# Patient Record
Sex: Female | Born: 1995 | Race: White | Hispanic: No | Marital: Single | Smoking: Never smoker
Health system: Southern US, Community
[De-identification: ages and names within clinical notes are randomized; demographics above are authoritative.]

## PROBLEM LIST (undated history)

## (undated) ENCOUNTER — Inpatient Hospital Stay: Payer: Self-pay

## (undated) DIAGNOSIS — R51 Headache: Secondary | ICD-10-CM

## (undated) DIAGNOSIS — B009 Herpesviral infection, unspecified: Secondary | ICD-10-CM

## (undated) DIAGNOSIS — R112 Nausea with vomiting, unspecified: Secondary | ICD-10-CM

## (undated) DIAGNOSIS — J45909 Unspecified asthma, uncomplicated: Secondary | ICD-10-CM

## (undated) DIAGNOSIS — Z9889 Other specified postprocedural states: Secondary | ICD-10-CM

## (undated) DIAGNOSIS — R519 Headache, unspecified: Secondary | ICD-10-CM

## (undated) DIAGNOSIS — K219 Gastro-esophageal reflux disease without esophagitis: Secondary | ICD-10-CM

## (undated) HISTORY — PX: WISDOM TOOTH EXTRACTION: SHX21

## (undated) HISTORY — PX: EYE SURGERY: SHX253

---
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
## 2015-09-26 ENCOUNTER — Encounter: Payer: Self-pay | Admitting: Emergency Medicine

## 2015-09-26 ENCOUNTER — Emergency Department
Admission: EM | Admit: 2015-09-26 | Discharge: 2015-09-26 | Disposition: A | Discharge status: Home / Self Care | Payer: Self-pay | Attending: Emergency Medicine | Admitting: Emergency Medicine

## 2015-09-26 DIAGNOSIS — Z3202 Encounter for pregnancy test, result negative: Secondary | ICD-10-CM | POA: Insufficient documentation

## 2015-09-26 DIAGNOSIS — R300 Dysuria: Secondary | ICD-10-CM | POA: Insufficient documentation

## 2015-09-26 LAB — URINALYSIS COMPLETE WITH MICROSCOPIC (ARMC ONLY)
BILIRUBIN URINE: NEGATIVE
GLUCOSE, UA: NEGATIVE mg/dL
HGB URINE DIPSTICK: NEGATIVE
Ketones, ur: NEGATIVE mg/dL
NITRITE: NEGATIVE
Protein, ur: NEGATIVE mg/dL
RBC / HPF: NONE SEEN RBC/hpf (ref 0–5)
Specific Gravity, Urine: 1.016 (ref 1.005–1.030)
WBC, UA: NONE SEEN WBC/hpf (ref 0–5)
pH: 7 (ref 5.0–8.0)

## 2015-09-26 LAB — POCT PREGNANCY, URINE: Preg Test, Ur: NEGATIVE

## 2015-09-26 MED ORDER — FLUCONAZOLE 150 MG PO TABS: 150.0000 mg | ORAL_TABLET | Freq: Every day | ORAL | Status: DC

## 2015-09-26 MED ORDER — PHENAZOPYRIDINE HCL 200 MG PO TABS: 200.0000 mg | ORAL_TABLET | Freq: Three times a day (TID) | ORAL | Status: DC | PRN

## 2015-09-26 NOTE — ED Notes (Signed)
Pt to ed with c/o urinary frequency and burning x 1 week.

## 2015-09-26 NOTE — ED Notes (Signed)
Urinary freq and pain for couple of days

## 2015-09-26 NOTE — ED Provider Notes (Signed)
[facility] Emergency Department Provider Note  ____________________________________________  Time seen: Approximately 5:15 PM  I have reviewed the triage vital signs and the nursing notes.   HISTORY  Chief Complaint Urinary Frequency    HPI Brandy Ward is a 20 y.o. female patient complained of urinary frequency, urgency and burning for one week. Patient denies any fever associated with this complaint patient denies any flank pain. Patient whitish vaginal discharge 2 days ago and none today. Denies any vaginal lesions. Patient rates her pain discomfort as 8/10. No palliative measures taken for this complaint. Patient stated no menstrual cycle secondary to birth control.Marland Kitchen  History reviewed. No pertinent past medical history.  There are no active problems to display for this patient.   History reviewed. No pertinent past surgical history.  Current Outpatient Rx  Name  Route  Sig  Dispense  Refill  . fluconazole (DIFLUCAN) 150 MG tablet   Oral   Take 1 tablet (150 mg total) by mouth daily.   1 tablet   0   . phenazopyridine (PYRIDIUM) 200 MG tablet   Oral   Take 1 tablet (200 mg total) by mouth 3 (three) times daily as needed for pain.   6 tablet   0     Allergies Review of patient's allergies indicates no known allergies.  History reviewed. No pertinent family history.  Social History Social History  Substance Use Topics  . Smoking status: Never Smoker   . Smokeless tobacco: None  . Alcohol Use: No    Review of Systems Constitutional: No fever/chills Eyes: No visual changes. ENT: No sore throat. Cardiovascular: Denies chest pain. Respiratory: Denies shortness of breath. Gastrointestinal: No abdominal pain.  No nausea, no vomiting.  No diarrhea.  No constipation. Genitourinary: Positive for dysuria. Musculoskeletal: Negative for back pain. Skin: Negative for rash. Neurological: Negative for headaches, focal weakness or  numbness. 10-point ROS otherwise negative.  ____________________________________________   PHYSICAL EXAM:  VITAL SIGNS: ED Triage Vitals  Enc Vitals Group     BP 09/26/15 1612 127/74 mmHg     Pulse Rate 09/26/15 1612 91     Resp 09/26/15 1612 20     Temp 09/26/15 1612 97.5 F (36.4 C)     Temp Source 09/26/15 1612 Oral     SpO2 09/26/15 1612 100 %     Weight 09/26/15 1612 140 lb (63.504 kg)     Height 09/26/15 1612  (1.651 m)     Head Cir --      Peak Flow --      Pain Score 09/26/15 1612 8     Pain Loc --      Pain Edu? --      Excl. in GC? --     Constitutional: Alert and oriented. Well appearing and in no acute distress. Eyes: Conjunctivae are normal. PERRL. EOMI. Head: Atraumatic. Nose: No congestion/rhinnorhea. Mouth/Throat: Mucous membranes are moist.  Oropharynx non-erythematous. Neck: No stridor.  No cervical spine tenderness to palpation. Hematological/Lymphatic/Immunilogical: No cervical lymphadenopathy. Cardiovascular: Normal rate, regular rhythm. Grossly normal heart sounds.  Good peripheral circulation. Respiratory: Normal respiratory effort.  No retractions. Lungs CTAB. Gastrointestinal: Soft and nontender. No distention. No abdominal bruits. No CVA tenderness. Genitourinary: Exam deferred Musculoskeletal: No lower extremity tenderness nor edema.  No joint effusions. Neurologic:  Normal speech and language. No gross focal neurologic deficits are appreciated. No gait instability. Skin:  Skin is warm, dry and intact. No rash noted. Psychiatric: Mood and affect are normal. Speech  and behavior are normal.  ____________________________________________   LABS (all labs ordered are listed, but only abnormal results are displayed)  Labs Reviewed  URINALYSIS COMPLETEWITH MICROSCOPIC ([facility] ONLY) - Abnormal; Notable for the following:    Color, Urine YELLOW (*)    APPearance HAZY (*)    Leukocytes, UA TRACE (*)    Bacteria, UA RARE (*)    Squamous  Epithelial / LPF 0-5 (*)    All other components within normal limits  POC URINE PREG, ED  POCT PREGNANCY, URINE   ____________________________________________  EKG   ____________________________________________  RADIOLOGY   ____________________________________________   PROCEDURES  Procedure(s) performed: None  Critical Care performed: No  ____________________________________________   INITIAL IMPRESSION / ASSESSMENT AND PLAN / ED COURSE  Pertinent labs & imaging results that were available during my care of the patient were reviewed by me and considered in my medical decision making (see chart for details).  Dysuria. Discussed negative findings on urinalysis. Due to the patient's history of dysuria and a whitish vaginal discharge was treated with Pyridium and Diflucan. Advised patient to follow-up with the [facility] clinic if there is no improvement or complaint in 2-3 days.  ____________________________________________   FINAL CLINICAL IMPRESSION(S) / ED DIAGNOSES  Final diagnoses:  Dysuria      Joni Reining, PA-C 09/26/15 1749  Phineas Semen, MD 09/26/15 1859

## 2015-09-26 NOTE — Discharge Instructions (Signed)

## 2015-12-18 ENCOUNTER — Emergency Department
Admission: EM | Admit: 2015-12-18 | Discharge: 2015-12-18 | Disposition: A | Discharge status: Home / Self Care | Payer: Medicaid Other | Attending: Emergency Medicine | Admitting: Emergency Medicine

## 2015-12-18 DIAGNOSIS — J45909 Unspecified asthma, uncomplicated: Secondary | ICD-10-CM | POA: Insufficient documentation

## 2015-12-18 DIAGNOSIS — J029 Acute pharyngitis, unspecified: Secondary | ICD-10-CM | POA: Insufficient documentation

## 2015-12-18 HISTORY — DX: Unspecified asthma, uncomplicated: J45.909

## 2015-12-18 LAB — POCT RAPID STREP A: Streptococcus, Group A Screen (Direct): NEGATIVE

## 2015-12-18 MED ORDER — LIDOCAINE VISCOUS 2 % MT SOLN: 20.0000 mL | OROMUCOSAL | Status: DC | PRN

## 2015-12-18 MED ORDER — AMOXICILLIN 500 MG PO TABS: 500.0000 mg | ORAL_TABLET | Freq: Two times a day (BID) | ORAL | Status: DC

## 2015-12-18 NOTE — Discharge Instructions (Signed)

## 2015-12-18 NOTE — ED Provider Notes (Signed)
[facility] Emergency Department Provider Note  ____________________________________________  Time seen: Approximately 11:52 AM  I have reviewed the triage vital signs and the nursing notes.   HISTORY  Chief Complaint Sore Throat    HPI Brandy Ward is a 20 y.o. female resents for evaluation of sore throats and chest today. Patient states that extremely painful to swallow. Feels like she swallowing razor blades. Denies any trauma no other injuries denies any runny nose congestion drainage. Describes her pain as 8 out of 10 sudden onset.   Past Medical History  Diagnosis Date  . Asthma     There are no active problems to display for this patient.   History reviewed. No pertinent past surgical history.  Current Outpatient Rx  Name  Route  Sig  Dispense  Refill  . amoxicillin (AMOXIL) 500 MG tablet   Oral   Take 1 tablet (500 mg total) by mouth 2 (two) times daily.   20 tablet   0   . fluconazole (DIFLUCAN) 150 MG tablet   Oral   Take 1 tablet (150 mg total) by mouth daily.   1 tablet   0   . lidocaine (XYLOCAINE) 2 % solution   Mouth/Throat   Use as directed 20 mLs in the mouth or throat as needed for mouth pain.   100 mL   0   . phenazopyridine (PYRIDIUM) 200 MG tablet   Oral   Take 1 tablet (200 mg total) by mouth 3 (three) times daily as needed for pain.   6 tablet   0     Allergies Review of patient's allergies indicates no known allergies.  No family history on file.  Social History Social History  Substance Use Topics  . Smoking status: Never Smoker   . Smokeless tobacco: None  . Alcohol Use: No    Review of Systems Constitutional: No fever/chills Eyes: No visual changes. ENT: Positive sore throat. Cardiovascular: Denies chest pain. Respiratory: Denies shortness of breath. Musculoskeletal: Negative for back pain. Skin: Negative for rash. Neurological: Negative for headaches, focal weakness or  numbness.  10-point ROS otherwise negative.  ____________________________________________   PHYSICAL EXAM:  VITAL SIGNS: ED Triage Vitals  Enc Vitals Group     BP 12/18/15 1124 110/67 mmHg     Pulse Rate 12/18/15 1124 76     Resp 12/18/15 1124 16     Temp 12/18/15 1124 97.7 F (36.5 C)     Temp Source 12/18/15 1124 Oral     SpO2 12/18/15 1124 100 %     Weight 12/18/15 1124 130 lb (58.968 kg)     Height 12/18/15 1124  (1.676 m)     Head Cir --      Peak Flow --      Pain Score 12/18/15 1125 8     Pain Loc --      Pain Edu? --      Excl. in GC? --     Constitutional: Alert and oriented. Well appearing and in no acute distress. Nose: No congestion/rhinnorhea. Mouth/Throat: Mucous membranes are moist.  Oropharynx very erythematous. No exudate noted. Neck: No stridor.  No cervical adenopathy noted. Cardiovascular: Normal rate, regular rhythm. Grossly normal heart sounds.  Good peripheral circulation. Respiratory: Normal respiratory effort.  No retractions. Lungs CTAB. Musculoskeletal: No lower extremity tenderness nor edema.  No joint effusions. Neurologic:  Normal speech and language. No gross focal neurologic deficits are appreciated. No gait instability. Skin:  Skin is warm, dry  and intact. No rash noted. Psychiatric: Mood and affect are normal. Speech and behavior are normal.  ____________________________________________   LABS (all labs ordered are listed, but only abnormal results are displayed)  Labs Reviewed  POCT RAPID STREP A   ____________________________________________    PROCEDURES  Procedure(s) performed: None  Critical Care performed: No  ____________________________________________   INITIAL IMPRESSION / ASSESSMENT AND PLAN / ED COURSE  Pertinent labs & imaging results that were available during my care of the patient were reviewed by me and considered in my medical decision making (see chart for details).  Acute pharyngitis. Rx given  for amoxicillin 500 mg and viscous lidocaine solution. Patient follow-up with PCP or return to ER with any worsening symptomology. Patient voices no other emergency medical complaints at this time. ____________________________________________   FINAL CLINICAL IMPRESSION(S) / ED DIAGNOSES  Final diagnoses:  Acute pharyngitis, unspecified pharyngitis type     This chart was dictated using voice recognition software/Dragon. Despite best efforts to proofread, errors can occur which can change the meaning. Any change was purely unintentional.   Evangeline Dakinharles M Yekaterina Escutia, PA-C 12/18/15 1430  Jeanmarie PlantJames A McShane, MD 12/18/15 [id number]

## 2015-12-18 NOTE — ED Notes (Signed)
Pt c/o sore throat since yesterday 

## 2016-02-06 ENCOUNTER — Encounter: Payer: Self-pay | Admitting: Emergency Medicine

## 2016-02-06 ENCOUNTER — Emergency Department
Admission: EM | Admit: 2016-02-06 | Discharge: 2016-02-07 | Disposition: A | Discharge status: Home / Self Care | Payer: Medicaid Other | Attending: Student | Admitting: Student

## 2016-02-06 DIAGNOSIS — Z79899 Other long term (current) drug therapy: Secondary | ICD-10-CM | POA: Insufficient documentation

## 2016-02-06 DIAGNOSIS — R1033 Periumbilical pain: Secondary | ICD-10-CM

## 2016-02-06 DIAGNOSIS — R110 Nausea: Secondary | ICD-10-CM | POA: Insufficient documentation

## 2016-02-06 DIAGNOSIS — J45909 Unspecified asthma, uncomplicated: Secondary | ICD-10-CM | POA: Insufficient documentation

## 2016-02-06 LAB — COMPREHENSIVE METABOLIC PANEL
ALBUMIN: 4.7 g/dL (ref 3.5–5.0)
ALK PHOS: 78 U/L (ref 38–126)
ALT: 29 U/L (ref 14–54)
AST: 26 U/L (ref 15–41)
Anion gap: 6 (ref 5–15)
BUN: 12 mg/dL (ref 6–20)
CHLORIDE: 104 mmol/L (ref 101–111)
CO2: 27 mmol/L (ref 22–32)
CREATININE: 0.61 mg/dL (ref 0.44–1.00)
Calcium: 9 mg/dL (ref 8.9–10.3)
GFR calc non Af Amer: >60 mL/min (ref >60)
Glucose, Bld: 93 mg/dL (ref 65–99)
POTASSIUM: 3.8 mmol/L (ref 3.5–5.1)
SODIUM: 137 mmol/L (ref 135–145)
Total Bilirubin: 0.7 mg/dL (ref 0.3–1.2)
Total Protein: 7.7 g/dL (ref 6.5–8.1)

## 2016-02-06 LAB — CBC
HEMATOCRIT: 42.2 % (ref 35.0–47.0)
HEMOGLOBIN: 15 g/dL (ref 12.0–16.0)
MCH: 32 pg (ref 26.0–34.0)
MCHC: 35.5 g/dL (ref 32.0–36.0)
MCV: 90 fL (ref 80.0–100.0)
PLATELETS: 202 10*3/uL (ref 150–440)
RBC: 4.69 MIL/uL (ref 3.80–5.20)
RDW: 12 % (ref 11.5–14.5)
WBC: 7.2 10*3/uL (ref 3.6–11.0)

## 2016-02-06 LAB — LIPASE, BLOOD: Lipase: 22 U/L (ref 11–51)

## 2016-02-06 LAB — URINALYSIS COMPLETE WITH MICROSCOPIC (ARMC ONLY)
Bacteria, UA: NONE SEEN
Bilirubin Urine: NEGATIVE
Glucose, UA: NEGATIVE mg/dL
KETONES UR: NEGATIVE mg/dL
LEUKOCYTES UA: NEGATIVE
Nitrite: NEGATIVE
PH: 6 (ref 5.0–8.0)
PROTEIN: NEGATIVE mg/dL
Specific Gravity, Urine: 1.005 (ref 1.005–1.030)

## 2016-02-06 LAB — POCT PREGNANCY, URINE: PREG TEST UR: NEGATIVE

## 2016-02-06 NOTE — ED Notes (Signed)
Patient ambulatory to triage with steady gait, without difficulty or distress noted; pt reports generalized abd pain x 2 days accomp by nausea

## 2016-02-07 MED ORDER — POLYETHYLENE GLYCOL 3350 17 GM/SCOOP PO POWD: ORAL | Status: DC

## 2016-02-07 MED ORDER — ACETAMINOPHEN 500 MG PO TABS
1000.0000 mg | ORAL_TABLET | Freq: Once | ORAL | Status: AC
  Administered 2016-02-07: 1000 mg via ORAL
  Filled 2016-02-07: qty 2

## 2016-02-07 MED ORDER — ONDANSETRON 4 MG PO TBDP: 4.0000 mg | ORAL_TABLET | Freq: Three times a day (TID) | ORAL | Status: DC | PRN

## 2016-02-07 MED ORDER — IBUPROFEN 600 MG PO TABS
600.0000 mg | ORAL_TABLET | Freq: Once | ORAL | Status: AC
  Administered 2016-02-07: 600 mg via ORAL
  Filled 2016-02-07: qty 1

## 2016-02-07 NOTE — ED Provider Notes (Addendum)
[facility] Emergency Department Provider Note   ____________________________________________  Time seen: Approximately 12:02 AM  I have reviewed the triage vital signs and the nursing notes.   HISTORY  Chief Complaint Abdominal Pain    HPI Brandy Ward is a 20 y.o. female history of asthma who presents for evaluation of 2 days of periumbilical abdominal pain, gradual onset, constant, moderate, no modifying factors. She has also felt nauseated but has had no vomiting. She has had issues with constipation for some time. She reports that her mom gave her a laxative today and she has had several bowel movements however prior to that she had not had a bowel movement in more than 3 or 4 days. Typically she will go a week without having a bowel movement. No vomiting, no measured fevers though her mother reports that she felt "warm". Difficulty breathing. No dysuria. She denies any abnormal vaginal bleeding or vaginal discharge.   Past Medical History  Diagnosis Date  . Asthma     There are no active problems to display for this patient.   Past Surgical History  Procedure Laterality Date  . Eye surgery      Current Outpatient Rx  Name  Route  Sig  Dispense  Refill  . albuterol (PROVENTIL HFA;VENTOLIN HFA) 108 (90 Base) MCG/ACT inhaler   Inhalation   Inhale 2 puffs into the lungs every 6 (six) hours as needed for wheezing or shortness of breath.         . cetirizine (ZYRTEC) 10 MG tablet   Oral   Take 10 mg by mouth daily.         Marland Kitchen omeprazole (PRILOSEC) 40 MG capsule   Oral   Take 40 mg by mouth daily.         Marland Kitchen amoxicillin (AMOXIL) 500 MG tablet   Oral   Take 1 tablet (500 mg total) by mouth 2 (two) times daily.   20 tablet   0   . fluconazole (DIFLUCAN) 150 MG tablet   Oral   Take 1 tablet (150 mg total) by mouth daily.   1 tablet   0   . lidocaine (XYLOCAINE) 2 % solution   Mouth/Throat   Use as directed 20 mLs in the mouth or  throat as needed for mouth pain.   100 mL   0   . phenazopyridine (PYRIDIUM) 200 MG tablet   Oral   Take 1 tablet (200 mg total) by mouth 3 (three) times daily as needed for pain.   6 tablet   0     Allergies Review of patient's allergies indicates no known allergies.  No family history on file.  Social History Social History  Substance Use Topics  . Smoking status: Never Smoker   . Smokeless tobacco: None  . Alcohol Use: No    Review of Systems Constitutional: No fever/chills Eyes: No visual changes. ENT: No sore throat. Cardiovascular: Denies chest pain. Respiratory: Denies shortness of breath. Gastrointestinal: + abdominal pain.  + nausea, no vomiting.  No diarrhea.  + constipation. Genitourinary: Negative for dysuria. Musculoskeletal: Negative for back pain. Skin: Negative for rash. Neurological: Negative for headaches, focal weakness or numbness.  10-point ROS otherwise negative.  ____________________________________________   PHYSICAL EXAM:  VITAL SIGNS: ED Triage Vitals  Enc Vitals Group     BP 02/06/16 2137 125/75 mmHg     Pulse Rate 02/06/16 2137 78     Resp 02/06/16 2137 18     Temp  02/06/16 2137 98.4 F (36.9 C)     Temp Source 02/06/16 2137 Oral     SpO2 02/06/16 2137 100 %     Weight 02/06/16 2137 140 lb (63.504 kg)     Height 02/06/16 2137 5\' 6"  (1.676 m)     Head Cir --      Peak Flow --      Pain Score 02/06/16 2142 10     Pain Loc --      Pain Edu? --      Excl. in GC? --     Constitutional: Alert and oriented. Well appearing and in no acute distress. Eyes: Conjunctivae are normal. PERRL. EOMI. Head: Atraumatic. Nose: No congestion/rhinnorhea. Mouth/Throat: Mucous membranes are moist.  Oropharynx non-erythematous. Neck: No stridor. Supple without meningismus. Cardiovascular: Normal rate, regular rhythm. Grossly normal heart sounds.  Good peripheral circulation. Respiratory: Normal respiratory effort.  No retractions. Lungs  CTAB. Gastrointestinal: Soft and nontender. No distention. No abdominal bruits. No CVA tenderness. Genitourinary: deferred Musculoskeletal: No lower extremity tenderness nor edema.  No joint effusions. Neurologic:  Normal speech and language. No gross focal neurologic deficits are appreciated. No gait instability. Skin:  Skin is warm, dry and intact. No rash noted. Psychiatric: Mood and affect are normal. Speech and behavior are normal.  ____________________________________________   LABS (all labs ordered are listed, but only abnormal results are displayed)  Labs Reviewed  URINALYSIS COMPLETEWITH MICROSCOPIC ([facility] ONLY) - Abnormal; Notable for the following:    Color, Urine STRAW (*)    APPearance CLEAR (*)    Hgb urine dipstick 1+ (*)    Squamous Epithelial / LPF 0-5 (*)    All other components within normal limits  URINE CULTURE  LIPASE, BLOOD  COMPREHENSIVE METABOLIC PANEL  CBC  POC URINE PREG, ED  POCT PREGNANCY, URINE   ____________________________________________  EKG  none ____________________________________________  RADIOLOGY  none ____________________________________________   PROCEDURES  Procedure(s) performed: None  Critical Care performed: No  ____________________________________________   INITIAL IMPRESSION / ASSESSMENT AND PLAN / ED COURSE  Pertinent labs & imaging results that were available during my care of the patient were reviewed by me and considered in my medical decision making (see chart for details).  Brandy Ward is a 20 y.o. female history of asthma who presents for evaluation of 2 days of periumbilical abdominal pain, gradual onset, constant, moderate, no modifying factors. On exam, she is very well-appearing and in no acute distress, her vital signs are stable, she is afebrile. She has normal bowel sounds, and a benign abdominal examination without rebound, guarding, no tenderness in the right lower quadrant, right upper quadrant  or left lower quadrant specifically. I reviewed her labs. CBC, CMP, lipase unremarkable. Negative pregnancy test. Urinalysis is not consistent with infection, very few red and white blood cells but she is currently menstruating and I suspect this is contaminated specimen. We'll send culture. Nonspecific abdominal pain. I doubt any acute life or any intra-abdominal pathology in this well-appearing patient. We discussed that this may all be constipation related, will DC with MiraLAX. We discussed meticulous return precautions, need for close PCP follow-up and she is comfortable with the discharge plan. DC home. ____________________________________________   FINAL CLINICAL IMPRESSION(S) / ED DIAGNOSES  Final diagnoses:  Periumbilical abdominal pain      NEW MEDICATIONS STARTED DURING THIS VISIT:  New Prescriptions   No medications on file     Note:  This document was prepared using Dragon voice recognition software and may include  unintentional dictation errors.    Gayla Doss, MD 02/07/16 0028  Gayla Doss, MD 02/07/16 [id number]

## 2016-02-08 LAB — URINE CULTURE

## 2016-07-28 ENCOUNTER — Emergency Department: Payer: Medicaid Other

## 2016-07-28 ENCOUNTER — Emergency Department
Admission: EM | Admit: 2016-07-28 | Discharge: 2016-07-28 | Disposition: A | Discharge status: Home / Self Care | Payer: Medicaid Other | Attending: Emergency Medicine | Admitting: Emergency Medicine

## 2016-07-28 ENCOUNTER — Encounter: Payer: Self-pay | Admitting: Medical Oncology

## 2016-07-28 DIAGNOSIS — S9031XA Contusion of right foot, initial encounter: Secondary | ICD-10-CM | POA: Diagnosis not present

## 2016-07-28 DIAGNOSIS — S99921A Unspecified injury of right foot, initial encounter: Secondary | ICD-10-CM | POA: Diagnosis present

## 2016-07-28 DIAGNOSIS — Y9301 Activity, walking, marching and hiking: Secondary | ICD-10-CM | POA: Insufficient documentation

## 2016-07-28 DIAGNOSIS — Y999 Unspecified external cause status: Secondary | ICD-10-CM | POA: Insufficient documentation

## 2016-07-28 DIAGNOSIS — W228XXA Striking against or struck by other objects, initial encounter: Secondary | ICD-10-CM | POA: Diagnosis not present

## 2016-07-28 DIAGNOSIS — J45909 Unspecified asthma, uncomplicated: Secondary | ICD-10-CM | POA: Insufficient documentation

## 2016-07-28 DIAGNOSIS — Z79899 Other long term (current) drug therapy: Secondary | ICD-10-CM | POA: Insufficient documentation

## 2016-07-28 DIAGNOSIS — Y92002 Bathroom of unspecified non-institutional (private) residence single-family (private) house as the place of occurrence of the external cause: Secondary | ICD-10-CM | POA: Insufficient documentation

## 2016-07-28 MED ORDER — ACETAMINOPHEN-CODEINE #3 300-30 MG PO TABS: 1.0000 | ORAL_TABLET | Freq: Three times a day (TID) | ORAL | 0 refills | Status: DC | PRN

## 2016-07-28 NOTE — ED Provider Notes (Signed)
[facility] Emergency Department Provider Note ____________________________________________  Time seen: 1824  I have reviewed the triage vital signs and the nursing notes.  HISTORY  Chief Complaint  Foot Pain  HPI Brandy Ward is a 20 y.o. female presents to the ED for evaluation of right foot pain after she admits to accidentally hit in a metal curio in the hallway. Patient is up walking and the doctor the bathroom last night was actually hit the instep of her right foot. Since that time she's had pain, bruising, and disability to the right foot.  Past Medical History:  Diagnosis Date  . Asthma     There are no active problems to display for this patient.   Past Surgical History:  Procedure Laterality Date  . EYE SURGERY      Prior to Admission medications   Medication Sig Start Date End Date Taking? Authorizing Provider  acetaminophen-codeine (TYLENOL #3) 300-30 MG tablet Take 1 tablet by mouth every 8 (eight) hours as needed for moderate pain. 07/28/16   Elli Groesbeck V Ward Flo Berroa, PA-C  albuterol (PROVENTIL HFA;VENTOLIN HFA) 108 (90 Base) MCG/ACT inhaler Inhale 2 puffs into the lungs every 6 (six) hours as needed for wheezing or shortness of breath.    Historical Provider, MD  amoxicillin (AMOXIL) 500 MG tablet Take 1 tablet (500 mg total) by mouth 2 (two) times daily. 12/18/15   Evangeline Dakinharles M Beers, PA-C  cetirizine (ZYRTEC) 10 MG tablet Take 10 mg by mouth daily.    Historical Provider, MD  fluconazole (DIFLUCAN) 150 MG tablet Take 1 tablet (150 mg total) by mouth daily. 09/26/15   Joni Reiningonald K Smith, PA-C  lidocaine (XYLOCAINE) 2 % solution Use as directed 20 mLs in the mouth or throat as needed for mouth pain. 12/18/15   Charmayne Sheerharles M Beers, PA-C  omeprazole (PRILOSEC) 40 MG capsule Take 40 mg by mouth daily.    Historical Provider, MD  ondansetron (ZOFRAN ODT) 4 MG disintegrating tablet Take 1 tablet (4 mg total) by mouth every 8 (eight) hours as needed for nausea.  02/07/16   Gayla DossEryka A Gayle, MD  phenazopyridine (PYRIDIUM) 200 MG tablet Take 1 tablet (200 mg total) by mouth 3 (three) times daily as needed for pain. 09/26/15   Joni Reiningonald K Smith, PA-C  polyethylene glycol powder (GLYCOLAX/MIRALAX) powder Dissolve 1 tablespoon in 4-8 ounces of juice or water and drink twice daily until you're having one soft bowel movement per day. After that, drink once daily. 02/07/16   Gayla DossEryka A Gayle, MD    Allergies Patient has no known allergies.  No family history on file.  Social History Social History  Substance Use Topics  . Smoking status: Never Smoker  . Smokeless tobacco: Not on file  . Alcohol use No    Review of Systems  Constitutional: Negative for fever. Cardiovascular: Negative for chest pain. Respiratory: Negative for shortness of breath. Musculoskeletal: Negative for back pain. Right foot pain as above. Skin: Negative for rash. Medial foot bruising as above.  Neurological: Negative for headaches, focal weakness or numbness. ____________________________________________  PHYSICAL EXAM:  VITAL SIGNS: ED Triage Vitals  Enc Vitals Group     BP 07/28/16 1815 121/72     Pulse Rate 07/28/16 1815 88     Resp 07/28/16 1815 16     Temp 07/28/16 1815 97.8 F (36.6 C)     Temp Source 07/28/16 1815 Oral     SpO2 07/28/16 1815 99 %     Weight --  Height --      Head Circumference --      Peak Flow --      Pain Score 07/28/16 1800 9     Pain Loc --      Pain Edu? --      Excl. in GC? --    Constitutional: Alert and oriented. Well appearing and in no distress. Head: Normocephalic and atraumatic. Cardiovascular: Normal rate, regular rhythm. Normal distal pulses. Respiratory: Normal respiratory effort.  Musculoskeletal: Without obvious deformity, dislocation, or effusion. Patient with moderate ecchymosis to the medial aspect of the right foot. Normal ankle exam without laxity. No calf or Achilles tenderness is appreciated. Nontender with normal range  of motion in all extremities.  Neurologic:  Normal gait without ataxia. Normal speech and language. No gross focal neurologic deficits are appreciated. Skin:  Skin is warm, dry and intact. No rash noted. Medial foot bruising is noted. ____________________________________________   RADIOLOGY  Right Foot IMPRESSION: No acute abnormality noted.  I, Brandy Ward, [name], personally viewed and evaluated these images (plain radiographs) as part of my medical decision making, as well as reviewing the written report by the radiologist. ____________________________________________  PROCEDURES  Ace bandage - right foot ____________________________________________  INITIAL IMPRESSION / ASSESSMENT AND PLAN / ED COURSE  Patient with patient with right foot contusion without radiologic evidence of fracture or dislocation. She started with an Ace bandage for support and discharged with a prescription for Tylenol No. 3. She'll follow up with her primary care provider for ongoing symptom management. Ice and rest the foot elevated as needed.  Clinical Course    ____________________________________________  FINAL CLINICAL IMPRESSION(S) / ED DIAGNOSES  Final diagnoses:  Contusion of right foot, initial encounter      Lissa HoardJenise V Ward Sani Loiseau, PA-C 07/28/16 1915    Jeanmarie PlantJames A McShane, MD 07/28/16 2042

## 2016-07-28 NOTE — Discharge Instructions (Signed)
Wear the ace bandage as needed. Apply ice to reduce symptoms. Take the Tylenol with codeine along with ibuprofen for pain relief.

## 2016-07-28 NOTE — ED Triage Notes (Signed)
Patient reports hitting right foot on metal box last night. Foot is swollen with bruising to the inside.

## 2016-08-03 ENCOUNTER — Emergency Department: Payer: Medicaid Other

## 2016-08-03 ENCOUNTER — Encounter: Payer: Self-pay | Admitting: Emergency Medicine

## 2016-08-03 ENCOUNTER — Emergency Department
Admission: EM | Admit: 2016-08-03 | Discharge: 2016-08-03 | Disposition: A | Discharge status: Home / Self Care | Payer: Medicaid Other | Attending: Student in an Organized Health Care Education/Training Program | Admitting: Student in an Organized Health Care Education/Training Program

## 2016-08-03 DIAGNOSIS — J45909 Unspecified asthma, uncomplicated: Secondary | ICD-10-CM | POA: Insufficient documentation

## 2016-08-03 DIAGNOSIS — Z79899 Other long term (current) drug therapy: Secondary | ICD-10-CM | POA: Insufficient documentation

## 2016-08-03 DIAGNOSIS — R51 Headache: Secondary | ICD-10-CM | POA: Diagnosis present

## 2016-08-03 DIAGNOSIS — R519 Headache, unspecified: Secondary | ICD-10-CM

## 2016-08-03 LAB — URINALYSIS, COMPLETE (UACMP) WITH MICROSCOPIC
Bacteria, UA: NONE SEEN
Bilirubin Urine: NEGATIVE
GLUCOSE, UA: NEGATIVE mg/dL
HGB URINE DIPSTICK: NEGATIVE
KETONES UR: NEGATIVE mg/dL
Nitrite: NEGATIVE
PROTEIN: NEGATIVE mg/dL
Specific Gravity, Urine: 1.009 (ref 1.005–1.030)
pH: 7 (ref 5.0–8.0)

## 2016-08-03 LAB — POCT PREGNANCY, URINE: PREG TEST UR: NEGATIVE

## 2016-08-03 MED ORDER — METOCLOPRAMIDE HCL 5 MG/ML IJ SOLN
10.0000 mg | Freq: Once | INTRAMUSCULAR | Status: AC
  Administered 2016-08-03: 10 mg via INTRAVENOUS
  Filled 2016-08-03: qty 2

## 2016-08-03 MED ORDER — SODIUM CHLORIDE 0.9 % IV BOLUS (SEPSIS)
1000.0000 mL | Freq: Once | INTRAVENOUS | Status: AC
  Administered 2016-08-03: 1000 mL via INTRAVENOUS

## 2016-08-03 MED ORDER — DIPHENHYDRAMINE HCL 50 MG/ML IJ SOLN
25.0000 mg | Freq: Once | INTRAMUSCULAR | Status: AC
  Administered 2016-08-03: 25 mg via INTRAVENOUS
  Filled 2016-08-03: qty 1

## 2016-08-03 MED ORDER — KETOROLAC TROMETHAMINE 30 MG/ML IJ SOLN
30.0000 mg | Freq: Once | INTRAMUSCULAR | Status: AC
  Administered 2016-08-03: 30 mg via INTRAVENOUS
  Filled 2016-08-03: qty 1

## 2016-08-03 NOTE — ED Notes (Signed)
Pt discharged to home.  Family member driving.  Discharge instructions reviewed.  Verbalized understanding.  No questions or concerns at this time.  Teach back verified.  Pt in NAD.  No items left in ED.   

## 2016-08-03 NOTE — ED Triage Notes (Addendum)
Pt to ed with c/o headache x 3 days.  Pt states she has tried OTC meds without relief, hot and cold compresses without relief.  Pt states she has never had a headache like this before.  +nausea.  +dizziness, gait steady, ambulates with ease independently.  Pt alert and oriented at this time.  No vomiting noted. Skin warm and dry.

## 2016-08-03 NOTE — Discharge Instructions (Signed)
Increase fluids. Continue Tylenol or ibuprofen as needed for headache. Follow-up with Dr. Maryellen PileEason. Call the office next week and make an appointment. CT scan of your head did not show any tumors, head bleeds or abnormalities.

## 2016-08-03 NOTE — ED Notes (Signed)
Pad not working well, pt's mother signed discharge per patient's request.

## 2016-08-03 NOTE — ED Notes (Signed)
Pt c/o h/a x 5 days.  Pt alert and oriented, resp even and unlabored.  Pt c/o light sensitivity and nausea, denies emesis.  Pt sts she took 1 zofran w/ relief.  PERRLA

## 2016-08-03 NOTE — ED Provider Notes (Signed)
[facility] Emergency Department Provider Note  ____________________________________________   First MD Initiated Contact with Patient 08/03/16 1747     (approximate)  I have reviewed the triage vital signs and the nursing notes.   HISTORY  Chief Complaint Headache   HPI Brandy Ward is a 20 y.o. female is here with complaint of frontal headache for the last 3 days. She states that she is tried over-the-counter medication without any relief. She is also used hot and cold compresses again without any relief. Patient states that she has had nausea without vomiting, positive dizziness, but denies any visual changes. Patient states she has never had a headache like this before. She denies any injury. She denies any photophobia. Patient denies any paresthesias to her upper or lower extremities. She is continued to drink fluids but has had decreased appetite.  Patient rates her pain as a 10 over 10 at this time.  Past Medical History:  Diagnosis Date  . Asthma     There are no active problems to display for this patient.   Past Surgical History:  Procedure Laterality Date  . EYE SURGERY      Prior to Admission medications   Medication Sig Start Date End Date Taking? Authorizing Provider  albuterol (PROVENTIL HFA;VENTOLIN HFA) 108 (90 Base) MCG/ACT inhaler Inhale 2 puffs into the lungs every 6 (six) hours as needed for wheezing or shortness of breath.   Yes Historical Provider, MD  cetirizine (ZYRTEC) 10 MG tablet Take 10 mg by mouth daily.   Yes Historical Provider, MD  omeprazole (PRILOSEC) 40 MG capsule Take 40 mg by mouth daily.   Yes Historical Provider, MD    Allergies Patient has no known allergies.  History reviewed. No pertinent family history.  Social History Social History  Substance Use Topics  . Smoking status: Never Smoker  . Smokeless tobacco: Never Used  . Alcohol use No    Review of Systems Constitutional: No  fever/chills Eyes: No visual changes. ENT: No sore throat. Cardiovascular: Denies chest pain. Respiratory: Denies shortness of breath. Gastrointestinal: No abdominal pain.  No nausea, no vomiting.   Genitourinary: Negative for dysuria. Musculoskeletal: Negative for back pain. Skin: Negative for rash. Neurological: Positive for current headache , no focal weakness or numbness.  Positive dizziness.  10-point ROS otherwise negative.  ____________________________________________   PHYSICAL EXAM:  VITAL SIGNS: ED Triage Vitals  Enc Vitals Group     BP 08/03/16 1703 127/82     Pulse Rate 08/03/16 1703 74     Resp 08/03/16 1703 18     Temp --      Temp src --      SpO2 08/03/16 1703 100 %     Weight 08/03/16 1703 140 lb (63.5 kg)     Height --      Head Circumference --      Peak Flow --      Pain Score 08/03/16 1704 10     Pain Loc --      Pain Edu? --      Excl. in GC? --     Constitutional: Alert and oriented. Well appearing and in no acute distress. Eyes: Conjunctivae are normal. PERRL. EOMI. Head: Atraumatic. Nose: No congestion/rhinnorhea.  EACs and TMs are clear bilaterally. Mouth/Throat: Mucous membranes are moist.  Oropharynx non-erythematous. Neck: No stridor.  No cervical tenderness on palpation posteriorly. Range of motion is without restriction or pain. Hematological/Lymphatic/Immunilogical: No cervical lymphadenopathy. Cardiovascular: Normal rate, regular rhythm. Grossly  normal heart sounds.  Good peripheral circulation. Respiratory: Normal respiratory effort.  No retractions. Lungs CTAB. Gastrointestinal: Soft and nontender. No distention. Bowel sounds normoactive 4 quadrants. Musculoskeletal: Moves upper and lower extremities without any difficulty. Normal gait was noted. Neurologic:  Normal speech and language. No gross focal neurologic deficits are appreciated. No gait instability. Skin:  Skin is warm, dry and intact. No rash noted. Psychiatric: Mood and  affect are normal. Speech and behavior are normal.  ____________________________________________   LABS (all labs ordered are listed, but only abnormal results are displayed)  Labs Reviewed  URINALYSIS, COMPLETE (UACMP) WITH MICROSCOPIC - Abnormal; Notable for the following:       Result Value   Color, Urine STRAW (*)    APPearance CLEAR (*)    Leukocytes, UA SMALL (*)    Squamous Epithelial / LPF 0-5 (*)    All other components within normal limits  POC URINE PREG, ED  POCT PREGNANCY, URINE    RADIOLOGY  CT without contrast per radiologist:  IMPRESSION:  No acute intracranial abnormality     ____________________________________________   PROCEDURES  Procedure(s) performed: None  Procedures  Critical Care performed: No  ____________________________________________   INITIAL IMPRESSION / ASSESSMENT AND PLAN / ED COURSE  Pertinent labs & imaging results that were available during my care of the patient were reviewed by me and considered in my medical decision making (see chart for details).    Clinical Course    Patient was given normal saline 1 L bolus with Toradol 30 mg IV, Reglan 10 mg IV, and Benadryl 25 mg IV. Patient began feeling better and was able to eat [employer] without any continued nausea or vomiting. Patient was sleeping in the room and did not appear to be having any pain. Patient is to follow-up with Dr. Maryellen PileEason if any continued headache problems.  ____________________________________________   FINAL CLINICAL IMPRESSION(S) / ED DIAGNOSES  Final diagnoses:  Acute nonintractable headache, unspecified headache type      NEW MEDICATIONS STARTED DURING THIS VISIT:  Current Discharge Medication List       Note:  This document was prepared using Dragon voice recognition software and may include unintentional dictation errors.    Tommi RumpsRhonda L Summers, PA-C 08/03/16 2156    Willy EddyPatrick Robinson, MD 08/04/16 [phone]

## 2016-08-05 ENCOUNTER — Emergency Department
Admission: EM | Admit: 2016-08-05 | Discharge: 2016-08-05 | Disposition: A | Discharge status: Home / Self Care | Payer: Medicaid Other | Attending: Emergency Medicine | Admitting: Emergency Medicine

## 2016-08-05 ENCOUNTER — Encounter: Payer: Self-pay | Admitting: Emergency Medicine

## 2016-08-05 DIAGNOSIS — R51 Headache: Secondary | ICD-10-CM | POA: Diagnosis present

## 2016-08-05 DIAGNOSIS — Z79899 Other long term (current) drug therapy: Secondary | ICD-10-CM | POA: Insufficient documentation

## 2016-08-05 DIAGNOSIS — J45909 Unspecified asthma, uncomplicated: Secondary | ICD-10-CM | POA: Diagnosis not present

## 2016-08-05 DIAGNOSIS — R519 Headache, unspecified: Secondary | ICD-10-CM

## 2016-08-05 DIAGNOSIS — J0100 Acute maxillary sinusitis, unspecified: Secondary | ICD-10-CM | POA: Diagnosis not present

## 2016-08-05 MED ORDER — ONDANSETRON 4 MG PO TBDP: 4.0000 mg | ORAL_TABLET | Freq: Four times a day (QID) | ORAL | 0 refills | Status: DC | PRN

## 2016-08-05 MED ORDER — PREDNISONE 10 MG PO TABS: 10.0000 mg | ORAL_TABLET | Freq: Two times a day (BID) | ORAL | 0 refills | Status: DC

## 2016-08-05 MED ORDER — CETIRIZINE-PSEUDOEPHEDRINE ER 5-120 MG PO TB12: 1.0000 | ORAL_TABLET | Freq: Two times a day (BID) | ORAL | 0 refills | Status: DC

## 2016-08-05 MED ORDER — DEXAMETHASONE SODIUM PHOSPHATE 10 MG/ML IJ SOLN
10.0000 mg | Freq: Once | INTRAMUSCULAR | Status: AC
  Administered 2016-08-05: 10 mg via INTRAMUSCULAR
  Filled 2016-08-05: qty 1

## 2016-08-05 MED ORDER — PSEUDOEPHEDRINE HCL 30 MG PO TABS
30.0000 mg | ORAL_TABLET | Freq: Once | ORAL | Status: AC
  Administered 2016-08-05: 30 mg via ORAL
  Filled 2016-08-05: qty 1

## 2016-08-05 NOTE — Discharge Instructions (Signed)
Take the prescription meds as directed. Follow-up with your provider as needed. Consider using a room humidifier overnight.

## 2016-08-05 NOTE — ED Provider Notes (Signed)
[facility] Emergency Department Provider Note ____________________________________________  Time seen: 1217  I have reviewed the triage vital signs and the nursing notes.  HISTORY  History by the patient's mother.   Chief Complaint  Facial Pain and Headache  HPI Brandy Ward is a 20 y.o. female returns to the ED 2 days following a workup for an acute headache. The patient was evaluated 2 days prior fora frontal headache that she now describes is localized to the space between her eyes and under her cheeks. She also notes nasal congestion. She has allegedly "tried everything over-the-counter," without significant relief. She had her gynecologist call in a migraine medicine, she describes has not helped. She denies vomiting, vision change, weakness, or syncope. She has not tried any allergy or sinus medicine.   Past Medical History:  Diagnosis Date  . Asthma     There are no active problems to display for this patient.   Past Surgical History:  Procedure Laterality Date  . EYE SURGERY      Prior to Admission medications   Medication Sig Start Date End Date Taking? Authorizing Provider  albuterol (PROVENTIL HFA;VENTOLIN HFA) 108 (90 Base) MCG/ACT inhaler Inhale 2 puffs into the lungs every 6 (six) hours as needed for wheezing or shortness of breath.    Historical Provider, MD  cetirizine (ZYRTEC) 10 MG tablet Take 10 mg by mouth daily.    Historical Provider, MD  cetirizine-pseudoephedrine (ZYRTEC-D) 5-120 MG tablet Take 1 tablet by mouth 2 (two) times daily. 08/05/16   Tyonna Talerico V Bacon Samanta Gal, PA-C  omeprazole (PRILOSEC) 40 MG capsule Take 40 mg by mouth daily.    Historical Provider, MD  ondansetron (ZOFRAN ODT) 4 MG disintegrating tablet Take 1 tablet (4 mg total) by mouth every 6 (six) hours as needed for nausea or vomiting. 08/05/16   Charlesetta IvoryJenise V Bacon Anthone Prieur, PA-C  predniSONE (DELTASONE) 10 MG tablet Take 1 tablet (10 mg total) by mouth 2 (two) times  daily with a meal. 08/05/16   Laria Grimmett V Bacon Paulla Mcclaskey, PA-C   Allergies Patient has no known allergies.  No family history on file.  Social History Social History  Substance Use Topics  . Smoking status: Never Smoker  . Smokeless tobacco: Never Used  . Alcohol use No   Review of Systems  Constitutional: Negative for fever. Eyes: Negative for visual changes. ENT: Negative for sore throat. Cardiovascular: Negative for chest pain. Respiratory: Negative for shortness of breath. Gastrointestinal: Negative for abdominal pain, vomiting and diarrhea. Musculoskeletal: Negative for back pain. Neurological: Negative for focal weakness or numbness. Reports headache like above.  ____________________________________________  PHYSICAL EXAM:  VITAL SIGNS: ED Triage Vitals [08/05/16 1132]  Enc Vitals Group     BP 125/65     Pulse Rate (!) 102     Resp 20     Temp 98.3 F (36.8 C)     Temp Source Oral     SpO2 99 %     Weight 140 lb (63.5 kg)     Height      Head Circumference      Peak Flow      Pain Score 10     Pain Loc      Pain Edu?      Excl. in GC?    Constitutional: Alert and oriented. Well appearing and in no distress. Head: Normocephalic and atraumatic. Eyes: Conjunctivae are normal. PERRL. Normal extraocular movements Ears: Canals clear. TMs intact bilaterally. Nose: No congestion/rhinorrhea/epistaxis. Sinus congestion and  edematous turbinates.  Mouth/Throat: Mucous membranes are moist. Neck: Supple. No thyromegaly. Hematological/Lymphatic/Immunological: No cervical lymphadenopathy. Cardiovascular: Normal rate, regular rhythm. Normal distal pulses. Respiratory: Normal respiratory effort. No wheezes/rales/rhonchi. Gastrointestinal: Soft and nontender. No distention. Musculoskeletal: Nontender with normal range of motion in all extremities.  Neurologic:  Normal gait without ataxia. Normal speech and language. No gross focal neurologic deficits are  appreciated. Psychiatric: Mood and affect are normal. Patient exhibits appropriate insight and judgment. ____________________________________________  PROCEDURES  Pseudoephedrine 30 mg PO Decadron 10 mg IM ____________________________________________  INITIAL IMPRESSION / ASSESSMENT AND PLAN / ED COURSE  Patient with clinical symptoms are consistent with a sinus headache and not a migraine presentation at this time. She is discharged with prescriptions for prednisone, cetirizine-pseudoephedrine, and Zofran. She will follow with her primary care provider for ongoing management. Return precautions are reviewed.  Clinical Course    ____________________________________________  FINAL CLINICAL IMPRESSION(S) / ED DIAGNOSES  Final diagnoses:  Sinus headache  Acute non-recurrent maxillary sinusitis      Lissa HoardJenise V Bacon Man Bonneau, PA-C 08/05/16 1336    Charlynne Panderavid Hsienta Yao, MD 08/05/16 [id number]

## 2016-08-05 NOTE — ED Triage Notes (Signed)
Pt with sinus pain and headache.

## 2016-08-07 ENCOUNTER — Other Ambulatory Visit: Payer: Self-pay | Admitting: Obstetrics and Gynecology

## 2016-08-07 DIAGNOSIS — R51 Headache: Principal | ICD-10-CM

## 2016-08-07 DIAGNOSIS — R519 Headache, unspecified: Secondary | ICD-10-CM

## 2016-08-29 ENCOUNTER — Emergency Department
Admission: EM | Admit: 2016-08-29 | Discharge: 2016-08-29 | Disposition: A | Discharge status: Home / Self Care | Payer: Medicaid Other | Attending: Emergency Medicine | Admitting: Emergency Medicine

## 2016-08-29 ENCOUNTER — Emergency Department: Payer: Medicaid Other

## 2016-08-29 ENCOUNTER — Encounter: Payer: Self-pay | Admitting: Emergency Medicine

## 2016-08-29 DIAGNOSIS — R110 Nausea: Secondary | ICD-10-CM | POA: Insufficient documentation

## 2016-08-29 DIAGNOSIS — R1084 Generalized abdominal pain: Secondary | ICD-10-CM | POA: Diagnosis not present

## 2016-08-29 DIAGNOSIS — R52 Pain, unspecified: Secondary | ICD-10-CM

## 2016-08-29 DIAGNOSIS — J45909 Unspecified asthma, uncomplicated: Secondary | ICD-10-CM | POA: Diagnosis not present

## 2016-08-29 DIAGNOSIS — M791 Myalgia: Secondary | ICD-10-CM | POA: Insufficient documentation

## 2016-08-29 LAB — CBC
HEMATOCRIT: 37.8 % (ref 35.0–47.0)
Hemoglobin: 13.3 g/dL (ref 12.0–16.0)
MCH: 31.8 pg (ref 26.0–34.0)
MCHC: 35.2 g/dL (ref 32.0–36.0)
MCV: 90.4 fL (ref 80.0–100.0)
Platelets: 203 10*3/uL (ref 150–440)
RBC: 4.18 MIL/uL (ref 3.80–5.20)
RDW: 11.9 % (ref 11.5–14.5)
WBC: 5.2 10*3/uL (ref 3.6–11.0)

## 2016-08-29 LAB — URINALYSIS, COMPLETE (UACMP) WITH MICROSCOPIC
BACTERIA UA: NONE SEEN
BILIRUBIN URINE: NEGATIVE
Glucose, UA: NEGATIVE mg/dL
Hgb urine dipstick: NEGATIVE
KETONES UR: 5 mg/dL — AB
Nitrite: NEGATIVE
Protein, ur: 30 mg/dL — AB
Specific Gravity, Urine: 1.024 (ref 1.005–1.030)
pH: 5 (ref 5.0–8.0)

## 2016-08-29 LAB — COMPREHENSIVE METABOLIC PANEL
ALBUMIN: 4.1 g/dL (ref 3.5–5.0)
ALT: 15 U/L (ref 14–54)
ANION GAP: 6 (ref 5–15)
AST: 27 U/L (ref 15–41)
Alkaline Phosphatase: 65 U/L (ref 38–126)
BUN: 11 mg/dL (ref 6–20)
CHLORIDE: 106 mmol/L (ref 101–111)
CO2: 25 mmol/L (ref 22–32)
Calcium: 8.8 mg/dL — ABNORMAL LOW (ref 8.9–10.3)
Creatinine, Ser: 0.58 mg/dL (ref 0.44–1.00)
GFR calc non Af Amer: >60 mL/min (ref >60)
Glucose, Bld: 110 mg/dL — ABNORMAL HIGH (ref 65–99)
POTASSIUM: 3.7 mmol/L (ref 3.5–5.1)
SODIUM: 137 mmol/L (ref 135–145)
Total Bilirubin: 0.7 mg/dL (ref 0.3–1.2)
Total Protein: 7.4 g/dL (ref 6.5–8.1)

## 2016-08-29 LAB — LIPASE, BLOOD: LIPASE: 14 U/L (ref 11–51)

## 2016-08-29 LAB — TROPONIN I: Troponin I: <0.03 ng/mL (ref <0.03)

## 2016-08-29 LAB — POCT PREGNANCY, URINE: PREG TEST UR: NEGATIVE

## 2016-08-29 MED ORDER — TRAMADOL HCL 50 MG PO TABS: 50.0000 mg | ORAL_TABLET | Freq: Four times a day (QID) | ORAL | 0 refills | Status: DC | PRN

## 2016-08-29 MED ORDER — ONDANSETRON HCL 4 MG/2ML IJ SOLN
4.0000 mg | Freq: Once | INTRAMUSCULAR | Status: AC
  Administered 2016-08-29: 4 mg via INTRAVENOUS

## 2016-08-29 MED ORDER — ONDANSETRON 4 MG PO TBDP: 4.0000 mg | ORAL_TABLET | Freq: Three times a day (TID) | ORAL | 0 refills | Status: DC | PRN

## 2016-08-29 MED ORDER — KETOROLAC TROMETHAMINE 30 MG/ML IJ SOLN
INTRAMUSCULAR | Status: AC
  Administered 2016-08-29: 30 mg via INTRAVENOUS
  Filled 2016-08-29: qty 1

## 2016-08-29 MED ORDER — SODIUM CHLORIDE 0.9 % IV BOLUS (SEPSIS)
1000.0000 mL | Freq: Once | INTRAVENOUS | Status: AC
  Administered 2016-08-29: 1000 mL via INTRAVENOUS

## 2016-08-29 MED ORDER — ONDANSETRON HCL 4 MG/2ML IJ SOLN
INTRAMUSCULAR | Status: AC
  Administered 2016-08-29: 4 mg via INTRAVENOUS
  Filled 2016-08-29: qty 2

## 2016-08-29 MED ORDER — KETOROLAC TROMETHAMINE 30 MG/ML IJ SOLN
30.0000 mg | Freq: Once | INTRAMUSCULAR | Status: AC
  Administered 2016-08-29: 30 mg via INTRAVENOUS

## 2016-08-29 NOTE — ED Notes (Signed)
This RN stuck patient with butterfly needle, pt then had syncopal episode lasting approx 10 seconds. Pt then returned to consciousness, alert and speaking with this RN without difficulty. Pt's family noted to be upset, pt's mom noted to be yelling during this episode. Pt returned to normal level of consciousness and went for chest x-ray. This RN informed X-ray tech that patient would need to remain sitting.

## 2016-08-29 NOTE — ED Triage Notes (Signed)
Pt c/o HA, abdominal pain, and generalized body aches. Pt denies vomiting, states has been nauseous, pt's mom states has given patient phenergan and zofran without relief. Pt has hx of migraines, and recently had 4 wisdom teeth extracted on Tuesday. Pt also c/o high blood pressure at this time.

## 2016-08-29 NOTE — Discharge Instructions (Signed)
As we discussed please take your medications as prescribed, as needed. Please follow-up your primary care doctor in 1-2 days for recheck/reevaluation. Return to the emergency department for any worsening symptoms, fever, or any other symptom personally concerning to yourself.

## 2016-08-29 NOTE — ED Notes (Signed)
Pt c/o generalized body aches, abd pain, nausea w/ 1 episode of emesis.  Pt alert and oriented, abdle to move limbs on command w/o issue.  Family at bedside, mother worried that pt might have anxiety.

## 2016-08-29 NOTE — ED Provider Notes (Signed)
[facility] Emergency Department Provider Note  Time seen: 7:42 PM  I have reviewed the triage vital signs and the nursing notes.   HISTORY  Chief Complaint Dizziness    HPI Brandy Ward is a 21 y.o. female for the past medical history of asthma who presents to the emergency department for nausea and abdominal discomfort, and body aches. According to the patient she has been experiencing diffuse abdominal cramping since yesterday along with body aches. Subjective fever at home but afebrile in the emergency department. Patient took Phenergan and Zofran at home but continues to be nauseated although denies any vomiting. Patient recently had 4 teeth extracted several days ago. Denies any chest pain pressure or tightness. Denies any shortness of breath. Denies any diaphoresis. Patient did get dizzy and lightheaded but this was during a blood draw in the emergency department.  Past Medical History:  Diagnosis Date  . Asthma     There are no active problems to display for this patient.   Past Surgical History:  Procedure Laterality Date  . EYE SURGERY      Prior to Admission medications   Medication Sig Start Date End Date Taking? Authorizing Provider  albuterol (PROVENTIL HFA;VENTOLIN HFA) 108 (90 Base) MCG/ACT inhaler Inhale 2 puffs into the lungs every 6 (six) hours as needed for wheezing or shortness of breath.    Historical Provider, MD  cetirizine (ZYRTEC) 10 MG tablet Take 10 mg by mouth daily.    Historical Provider, MD  cetirizine-pseudoephedrine (ZYRTEC-D) 5-120 MG tablet Take 1 tablet by mouth 2 (two) times daily. 08/05/16   Jenise V Bacon Menshew, PA-C  omeprazole (PRILOSEC) 40 MG capsule Take 40 mg by mouth daily.    Historical Provider, MD  ondansetron (ZOFRAN ODT) 4 MG disintegrating tablet Take 1 tablet (4 mg total) by mouth every 6 (six) hours as needed for nausea or vomiting. 08/05/16   Charlesetta IvoryJenise V Bacon Menshew, PA-C  predniSONE (DELTASONE) 10 MG  tablet Take 1 tablet (10 mg total) by mouth 2 (two) times daily with a meal. 08/05/16   Jenise V Bacon Menshew, PA-C    No Known Allergies  History reviewed. No pertinent family history.  Social History Social History  Substance Use Topics  . Smoking status: Never Smoker  . Smokeless tobacco: Never Used  . Alcohol use No    Review of Systems Constitutional: Subjective fever at home has not measured a temperature. Afebrile in the emergency department. Denies cough or congestion. Cardiovascular: Negative for chest pain. Respiratory: Negative for shortness of breath. Negative for cough. Gastrointestinal: Diffuse abdominal "cramping" nausea but denies vomiting. Denies diarrhea. Genitourinary: Negative for dysuria. Neurological: Negative for headache 10-point ROS otherwise negative.  ____________________________________________   PHYSICAL EXAM:  VITAL SIGNS: ED Triage Vitals [08/29/16 1851]  Enc Vitals Group     BP 112/71     Pulse Rate (!) 109     Resp 18     Temp 98.3 F (36.8 C)     Temp Source Oral     SpO2 96 %     Weight 140 lb (63.5 kg)     Height 5\' 6"  (1.676 m)     Head Circumference      Peak Flow      Pain Score 10     Pain Loc      Pain Edu?      Excl. in GC?     Constitutional: Alert and oriented. Well appearing and in no distress. Eyes: Normal  exam ENT   Head: Normocephalic and atraumatic   Mouth/Throat: Mucous membranes are moist. Cardiovascular: Normal rate, regular rhythm. No murmur Respiratory: Normal respiratory effort without tachypnea nor retractions. Breath sounds are clear  Gastrointestinal: Soft, mild diffuse abdominal tenderness to palpation, no area appears to be focally tender. No rebound or guarding. No distention. Musculoskeletal: Nontender with normal range of motion in all extremities.  Neurologic:  Normal speech and language. No gross focal neurologic deficits Skin:  Skin is warm, dry and intact.  Psychiatric: Mood and  affect are normal.   ____________________________________________    EKG  EKG reviewed and interpreted by myself shows sinus tachycardia at 111 bpm. Narrow QRS, normal axis, normal intervals, the patient does have inferior T-wave inversions with mild depression. No elevation noted. Compared to her prior EKG the inferior changes are new compared to 02/22/14. ____________________________________________    RADIOLOGY  Chest x-ray negative   ____________________________________________   INITIAL IMPRESSION / ASSESSMENT AND PLAN / ED COURSE  Pertinent labs & imaging results that were available during my care of the patient were reviewed by me and considered in my medical decision making (see chart for details).  Patient presents the emergency department with abdominal cramping and nausea with diffuse body aches. Currently the patient appears well, afebrile, mild diffuse tenderness to palpation without focal tenderness. Patient does have EKG changes were over this EKG was performed after patient's syncopal versus near syncopal episode while obtaining lab work. We will repeat an EKG. We will check labs including a troponin although the patient denies any chest pain trouble breathing or diaphoresis. We will treat with Toradol, Zofran IV hydrate while awaiting results.  BP EKG reviewed and interpreted by myself shows normal sinus rhythm at 90 bpm. Narrow QRS, normal axis, normal intervals, largely normal ST segments much improved from previous EKG.   Patient is feeling much better. Highly suspect viral illness. I discussed return precautions for any fever 101 or higher abdominal pain. Otherwise patient is to follow-up with a primary care doctor in 1-2 days for recheck/reevaluation. Patient family are agreeable.  ____________________________________________   FINAL CLINICAL IMPRESSION(S) / ED DIAGNOSES   nausea Bodyaches Abdominal pain    Minna Antis, MD 08/29/16 2108

## 2016-11-10 ENCOUNTER — Emergency Department
Admission: EM | Admit: 2016-11-10 | Discharge: 2016-11-10 | Disposition: A | Discharge status: Home / Self Care | Payer: Medicaid Other | Attending: Emergency Medicine | Admitting: Emergency Medicine

## 2016-11-10 ENCOUNTER — Encounter: Payer: Self-pay | Admitting: *Deleted

## 2016-11-10 DIAGNOSIS — J45909 Unspecified asthma, uncomplicated: Secondary | ICD-10-CM | POA: Insufficient documentation

## 2016-11-10 DIAGNOSIS — R300 Dysuria: Secondary | ICD-10-CM | POA: Diagnosis present

## 2016-11-10 DIAGNOSIS — Z202 Contact with and (suspected) exposure to infections with a predominantly sexual mode of transmission: Secondary | ICD-10-CM | POA: Insufficient documentation

## 2016-11-10 DIAGNOSIS — Z79899 Other long term (current) drug therapy: Secondary | ICD-10-CM | POA: Insufficient documentation

## 2016-11-10 LAB — WET PREP, GENITAL
CLUE CELLS WET PREP: NONE SEEN
Sperm: NONE SEEN
TRICH WET PREP: NONE SEEN
Yeast Wet Prep HPF POC: NONE SEEN

## 2016-11-10 LAB — URINALYSIS, COMPLETE (UACMP) WITH MICROSCOPIC
Bilirubin Urine: NEGATIVE
GLUCOSE, UA: NEGATIVE mg/dL
KETONES UR: NEGATIVE mg/dL
LEUKOCYTES UA: NEGATIVE
Nitrite: NEGATIVE
PH: 5 (ref 5.0–8.0)
Protein, ur: NEGATIVE mg/dL
SPECIFIC GRAVITY, URINE: 1.012 (ref 1.005–1.030)

## 2016-11-10 LAB — CHLAMYDIA/NGC RT PCR (ARMC ONLY)
Chlamydia Tr: NOT DETECTED
N GONORRHOEAE: NOT DETECTED

## 2016-11-10 LAB — POCT PREGNANCY, URINE: Preg Test, Ur: NEGATIVE

## 2016-11-10 MED ORDER — PHENAZOPYRIDINE HCL 200 MG PO TABS: 200.0000 mg | ORAL_TABLET | Freq: Three times a day (TID) | ORAL | 0 refills | Status: DC | PRN

## 2016-11-10 NOTE — ED Triage Notes (Signed)
Pt wants to be checked for STD, pt reports burning with urination, pt also wants a pregnancy test

## 2016-11-10 NOTE — Discharge Instructions (Signed)
If the chlamydia or gonorrhea test comes back positive, you will be notified and the appropriate antibiotics will be sent to the pharmacy for you. Please [facility] up with the [facility] for any further concerns. If dysuria is not relieved by medication, follow up with primary care. Return to the ER for symptoms that change or worsen if you are unable to schedule an appointment.

## 2016-11-10 NOTE — ED Provider Notes (Signed)
[facility] Emergency Department Provider Note  ____________________________________________  Time seen: Approximately 5:29 PM  I have reviewed the triage vital signs and the nursing notes.   HISTORY  Chief Complaint Exposure to STD    HPI Brandy Ward is a 21 y.o. female who presents to the emergency department for STD testing. She states that her boyfriend of the one year called today and told her they needed to split up due to some type of infection. She does not know if he has any discharge, but states that she does have dysuria that started a few days ago. The patient also requests pregnancy testing.  Past Medical History:  Diagnosis Date  . Asthma     There are no active problems to display for this patient.   Past Surgical History:  Procedure Laterality Date  . EYE SURGERY      Prior to Admission medications   Medication Sig Start Date End Date Taking? Authorizing Provider  albuterol (PROVENTIL HFA;VENTOLIN HFA) 108 (90 Base) MCG/ACT inhaler Inhale 2 puffs into the lungs every 6 (six) hours as needed for wheezing or shortness of breath.    Historical Provider, MD  cetirizine (ZYRTEC) 10 MG tablet Take 10 mg by mouth daily.    Historical Provider, MD  cetirizine-pseudoephedrine (ZYRTEC-D) 5-120 MG tablet Take 1 tablet by mouth 2 (two) times daily. 08/05/16   Jenise V Bacon Menshew, PA-C  omeprazole (PRILOSEC) 40 MG capsule Take 40 mg by mouth daily.    Historical Provider, MD  ondansetron (ZOFRAN ODT) 4 MG disintegrating tablet Take 1 tablet (4 mg total) by mouth every 8 (eight) hours as needed for nausea or vomiting. 08/29/16   Minna Antis, MD  phenazopyridine (PYRIDIUM) 200 MG tablet Take 1 tablet (200 mg total) by mouth 3 (three) times daily as needed for pain. 11/10/16 11/10/17  Chinita Pester, FNP  predniSONE (DELTASONE) 10 MG tablet Take 1 tablet (10 mg total) by mouth 2 (two) times daily with a meal. 08/05/16   Jenise V Bacon Menshew,  PA-C  traMADol (ULTRAM) 50 MG tablet Take 1 tablet (50 mg total) by mouth every 6 (six) hours as needed. 08/29/16   Minna Antis, MD    Allergies Patient has no known allergies.  No family history on file.  Social History Social History  Substance Use Topics  . Smoking status: Never Smoker  . Smokeless tobacco: Never Used  . Alcohol use No    Review of Systems Constitutional: Negative for fever. Respiratory: Negative for shortness of breath or cough. Gastrointestinal: Negative for abdominal pain; negative for nausea , negative for vomiting. Genitourinary: Positive for dysuria , positive for vaginal discharge. Musculoskeletal: Negative for back pain. Skin: Negative for rash, lesion, or wound.. ____________________________________________   PHYSICAL EXAM:  VITAL SIGNS: ED Triage Vitals  Enc Vitals Group     BP 11/10/16 1712 140/83     Pulse Rate 11/10/16 1712 88     Resp 11/10/16 1712 20     Temp 11/10/16 1712 98.3 F (36.8 C)     Temp Source 11/10/16 1712 Oral     SpO2 11/10/16 1712 100 %     Weight 11/10/16 1711 133 lb (60.3 kg)     Height 11/10/16 1711 5\' 6"  (1.676 m)     Head Circumference --      Peak Flow --      Pain Score --      Pain Loc --      Pain Edu? --  Excl. in GC? --     Constitutional: Alert and oriented. Well appearing and in no acute distress. Eyes: Conjunctivae are normal. PERRL. EOMI. Head: Atraumatic. Nose: No congestion/rhinnorhea. Mouth/Throat: Mucous membranes are moist. Respiratory: Normal respiratory effort.  No retractions. Gastrointestinal: Abdomen is soft and nontender with no rebound or guarding Genitourinary: Pelvic exam: Vaginal walls have a thin, white coating. Cervix is friable with strawberry spots. No discharge noted at the cervical os. No tenderness on bimanual exam. Musculoskeletal: No extremity tenderness nor edema.  Neurologic:  Normal speech and language. No gross focal neurologic deficits are appreciated.  Speech is normal. No gait instability. Skin:  Skin is warm, dry and intact. No rash noted. Psychiatric: Mood and affect are normal. Speech and behavior are normal.  ____________________________________________   LABS (all labs ordered are listed, but only abnormal results are displayed)  Labs Reviewed  WET PREP, GENITAL - Abnormal; Notable for the following:       Result Value   WBC, Wet Prep HPF POC FEW (*)    All other components within normal limits  URINALYSIS, COMPLETE (UACMP) WITH MICROSCOPIC - Abnormal; Notable for the following:    Color, Urine YELLOW (*)    APPearance HAZY (*)    Hgb urine dipstick SMALL (*)    Bacteria, UA RARE (*)    Squamous Epithelial / LPF 6-30 (*)    All other components within normal limits  CHLAMYDIA/NGC RT PCR ([facility] ONLY)  POC URINE PREG, ED  POCT PREGNANCY, URINE   ____________________________________________  RADIOLOGY  Not indicated ____________________________________________   PROCEDURES  Procedure(s) performed: Pelvic exam as above.  ____________________________________________   INITIAL IMPRESSION / ASSESSMENT AND PLAN / ED COURSE  Patient will be notified chlamydia and gonorrhea screenings are positive and antibiotics will be called into the pharmacy if indicated. She was instructed to follow-up with [facility] for further evaluation. She will be given a 3 day prescription for Pyridium for dysuria. She was strongly encouraged follow-up with primary care provider if his symptoms do not resolve. She was instructed to return to the emergency department for symptoms that change or worsen if she is unable schedule an appointment. Pertinent labs & imaging results that were available during my care of the patient were reviewed by me and considered in my medical decision making (see chart for details). ___________________________________________   FINAL CLINICAL IMPRESSION(S) / ED DIAGNOSES  Final  diagnoses:  Possible exposure to STD  Dysuria    Note:  This document was prepared using Dragon voice recognition software and may include unintentional dictation errors.    Chinita PesterCari B Colum Colt, FNP 11/10/16 1914    Loleta Roseory Forbach, MD 11/10/16 2135

## 2017-03-18 ENCOUNTER — Encounter: Payer: Self-pay | Admitting: Emergency Medicine

## 2017-03-18 ENCOUNTER — Emergency Department
Admission: EM | Admit: 2017-03-18 | Discharge: 2017-03-18 | Discharge status: Against Medical Advice | Payer: Medicaid Other | Attending: Emergency Medicine | Admitting: Emergency Medicine

## 2017-03-18 DIAGNOSIS — R51 Headache: Secondary | ICD-10-CM | POA: Diagnosis not present

## 2017-03-18 HISTORY — DX: Headache, unspecified: R51.9

## 2017-03-18 HISTORY — DX: Gastro-esophageal reflux disease without esophagitis: K21.9

## 2017-03-18 HISTORY — DX: Headache: R51

## 2017-03-18 NOTE — ED Triage Notes (Signed)
C/O headaches.  Sees a Neurologist-- given baclofen, which usually helps. C?O headache x 3 days.  Describes throbbing in forehead.  Also c/o  Nausea.

## 2017-04-02 ENCOUNTER — Encounter: Payer: Self-pay | Admitting: Emergency Medicine

## 2017-04-02 ENCOUNTER — Emergency Department
Admission: EM | Admit: 2017-04-02 | Discharge: 2017-04-02 | Disposition: A | Discharge status: Home / Self Care | Payer: Medicaid Other | Attending: Emergency Medicine | Admitting: Emergency Medicine

## 2017-04-02 DIAGNOSIS — Z3490 Encounter for supervision of normal pregnancy, unspecified, unspecified trimester: Secondary | ICD-10-CM | POA: Diagnosis not present

## 2017-04-02 DIAGNOSIS — R1110 Vomiting, unspecified: Secondary | ICD-10-CM | POA: Insufficient documentation

## 2017-04-02 DIAGNOSIS — R109 Unspecified abdominal pain: Secondary | ICD-10-CM | POA: Diagnosis present

## 2017-04-02 DIAGNOSIS — J45909 Unspecified asthma, uncomplicated: Secondary | ICD-10-CM | POA: Diagnosis not present

## 2017-04-02 DIAGNOSIS — Z79899 Other long term (current) drug therapy: Secondary | ICD-10-CM | POA: Insufficient documentation

## 2017-04-02 LAB — COMPREHENSIVE METABOLIC PANEL
ALBUMIN: 4.5 g/dL (ref 3.5–5.0)
ALT: 13 U/L — AB (ref 14–54)
AST: 19 U/L (ref 15–41)
Alkaline Phosphatase: 57 U/L (ref 38–126)
Anion gap: 8 (ref 5–15)
BILIRUBIN TOTAL: 0.7 mg/dL (ref 0.3–1.2)
BUN: 8 mg/dL (ref 6–20)
CHLORIDE: 103 mmol/L (ref 101–111)
CO2: 26 mmol/L (ref 22–32)
CREATININE: 0.55 mg/dL (ref 0.44–1.00)
Calcium: 9.4 mg/dL (ref 8.9–10.3)
GFR calc Af Amer: >60 mL/min (ref >60)
GFR calc non Af Amer: >60 mL/min (ref >60)
GLUCOSE: 93 mg/dL (ref 65–99)
POTASSIUM: 4.2 mmol/L (ref 3.5–5.1)
Sodium: 137 mmol/L (ref 135–145)
TOTAL PROTEIN: 7.5 g/dL (ref 6.5–8.1)

## 2017-04-02 LAB — URINALYSIS, COMPLETE (UACMP) WITH MICROSCOPIC
Bacteria, UA: NONE SEEN
Bilirubin Urine: NEGATIVE
GLUCOSE, UA: NEGATIVE mg/dL
Ketones, ur: NEGATIVE mg/dL
NITRITE: NEGATIVE
PROTEIN: NEGATIVE mg/dL
SPECIFIC GRAVITY, URINE: 1.012 (ref 1.005–1.030)
pH: 6 (ref 5.0–8.0)

## 2017-04-02 LAB — CBC
HEMATOCRIT: 38.2 % (ref 35.0–47.0)
Hemoglobin: 13.3 g/dL (ref 12.0–16.0)
MCH: 31.7 pg (ref 26.0–34.0)
MCHC: 34.9 g/dL (ref 32.0–36.0)
MCV: 90.7 fL (ref 80.0–100.0)
PLATELETS: 224 10*3/uL (ref 150–440)
RBC: 4.21 MIL/uL (ref 3.80–5.20)
RDW: 12.4 % (ref 11.5–14.5)
WBC: 12 10*3/uL — ABNORMAL HIGH (ref 3.6–11.0)

## 2017-04-02 LAB — LIPASE, BLOOD: Lipase: 26 U/L (ref 11–51)

## 2017-04-02 LAB — POCT PREGNANCY, URINE: Preg Test, Ur: POSITIVE — AB

## 2017-04-02 LAB — HCG, QUANTITATIVE, PREGNANCY: hCG, Beta Chain, Quant, S: 55884 m[IU]/mL — ABNORMAL HIGH (ref <5)

## 2017-04-02 MED ORDER — DOXYLAMINE-PYRIDOXINE 10-10 MG PO TBEC: 1.0000 | DELAYED_RELEASE_TABLET | Freq: Every day | ORAL | 0 refills | Status: DC

## 2017-04-02 NOTE — ED Triage Notes (Signed)
States nausea, vomiting and abd pain x 2 weeks. States last menses was 6/22 and that she has had 2 positive pregnancy tests. Has not had any prenatal care yet.

## 2017-04-02 NOTE — ED Provider Notes (Signed)
[facility] Emergency Department Provider Note    ____________________________________________   I have reviewed the triage vital signs and the nursing notes.   HISTORY  Chief Complaint Abdominal Pain and Emesis   History limited by: Not Limited   HPI Brandy Ward is a 21 y.o. female who presents to the emergency department today because of concerns for nausea and vomiting the setting of early pregnancy. The patient states she is taking 2 positive pregnancy tests in the past week. Her last menstrual period was roughly 2 months ago. The patient has been feeling nauseous for a couple of weeks. She states she is having a hard time keeping solid food down. She has been trying to drink Gatorade's. She has not started prenatal vitamins. She has had some associated lower abdominal cramping. No bleeding.    Past Medical History:  Diagnosis Date  . Asthma   . Frequent headaches   . GERD (gastroesophageal reflux disease)     There are no active problems to display for this patient.   Past Surgical History:  Procedure Laterality Date  . EYE SURGERY      Prior to Admission medications   Medication Sig Start Date End Date Taking? Authorizing Provider  albuterol (PROVENTIL HFA;VENTOLIN HFA) 108 (90 Base) MCG/ACT inhaler Inhale 2 puffs into the lungs every 6 (six) hours as needed for wheezing or shortness of breath.    [provider]  cetirizine (ZYRTEC) 10 MG tablet Take 10 mg by mouth daily.    [provider]  cetirizine-pseudoephedrine (ZYRTEC-D) 5-120 MG tablet Take 1 tablet by mouth 2 (two) times daily. 08/05/16   Menshew, Charlesetta IvoryJenise V Bacon, PA-C  omeprazole (PRILOSEC) 40 MG capsule Take 40 mg by mouth daily.    [provider]  ondansetron (ZOFRAN ODT) 4 MG disintegrating tablet Take 1 tablet (4 mg total) by mouth every 8 (eight) hours as needed for nausea or vomiting. 08/29/16   Minna AntisPaduchowski, Kevin, MD  phenazopyridine (PYRIDIUM)  200 MG tablet Take 1 tablet (200 mg total) by mouth 3 (three) times daily as needed for pain. 11/10/16 11/10/17  Triplett, Kasandra Knudsenari B, FNP  predniSONE (DELTASONE) 10 MG tablet Take 1 tablet (10 mg total) by mouth 2 (two) times daily with a meal. 08/05/16   Menshew, Charlesetta IvoryJenise V Bacon, PA-C  traMADol (ULTRAM) 50 MG tablet Take 1 tablet (50 mg total) by mouth every 6 (six) hours as needed. 08/29/16   Minna AntisPaduchowski, Kevin, MD    Allergies Patient has no known allergies.  No family history on file.  Social History Social History  Substance Use Topics  . Smoking status: Never Smoker  . Smokeless tobacco: Never Used  . Alcohol use No    Review of Systems Constitutional: No fever/chills Eyes: No visual changes. ENT: No sore throat. Cardiovascular: Denies chest pain. Respiratory: Denies shortness of breath. Gastrointestinal: Positive for lower abdominal cramping. Positive for nausea.  Genitourinary: Negative for dysuria. Musculoskeletal: Negative for back pain. Skin: Negative for rash. Neurological: Negative for headaches, focal weakness or numbness.  ____________________________________________   PHYSICAL EXAM:  VITAL SIGNS: ED Triage Vitals  Enc Vitals Group     BP 04/02/17 1743 99/63     Pulse Rate 04/02/17 1743 86     Resp 04/02/17 1743 20     Temp 04/02/17 1743 98.4 F (36.9 C)     Temp Source 04/02/17 1743 Oral     SpO2 04/02/17 1743 100 %     Weight 04/02/17 1745 142 lb (64.4  kg)     Height 04/02/17 1745 5\' 6"  (1.676 m)     Head Circumference --      Peak Flow --      Pain Score 04/02/17 1743 10   Constitutional: Alert and oriented. Well appearing and in no distress. Eyes: Conjunctivae are normal.  ENT   Head: Normocephalic and atraumatic.   Nose: No congestion/rhinnorhea.   Mouth/Throat: Mucous membranes are moist.   Neck: No stridor. Hematological/Lymphatic/Immunilogical: No cervical lymphadenopathy. Cardiovascular: Normal rate, regular rhythm.  No murmurs,  rubs, or gallops.  Respiratory: Normal respiratory effort without tachypnea nor retractions. Breath sounds are clear and equal bilaterally. No wheezes/rales/rhonchi. Gastrointestinal: Soft and non tender. No rebound. No guarding.  Genitourinary: Deferred Musculoskeletal: Normal range of motion in all extremities. No lower extremity edema. Neurologic:  Normal speech and language. No gross focal neurologic deficits are appreciated.  Skin:  Skin is warm, dry and intact. No rash noted. Psychiatric: Mood and affect are normal. Speech and behavior are normal. Patient exhibits appropriate insight and judgment.  ____________________________________________    LABS (pertinent positives/negatives)  Labs Reviewed  COMPREHENSIVE METABOLIC PANEL - Abnormal; Notable for the following:       Result Value   ALT 13 (*)    All other components within normal limits  CBC - Abnormal; Notable for the following:    WBC 12.0 (*)    All other components within normal limits  URINALYSIS, COMPLETE (UACMP) WITH MICROSCOPIC - Abnormal; Notable for the following:    Color, Urine YELLOW (*)    APPearance HAZY (*)    Hgb urine dipstick SMALL (*)    Leukocytes, UA SMALL (*)    Squamous Epithelial / LPF 6-30 (*)    All other components within normal limits  HCG, QUANTITATIVE, PREGNANCY - Abnormal; Notable for the following:    hCG, Beta Chain, Quant, S 16,109 (*)    All other components within normal limits  POCT PREGNANCY, URINE - Abnormal; Notable for the following:    Preg Test, Ur POSITIVE (*)    All other components within normal limits  LIPASE, BLOOD     ____________________________________________   EKG  None  ____________________________________________    RADIOLOGY  None  ____________________________________________   PROCEDURES  Procedures  ____________________________________________   INITIAL IMPRESSION / ASSESSMENT AND PLAN / ED COURSE  Pertinent labs & imaging results  that were available during my care of the patient were reviewed by me and considered in my medical decision making (see chart for details).  Patient presented to the emergency department today because of concerns for nausea and vomiting in early pregnancy. Testing here does confirm patient is pregnant. Beta hCG quantitative 55,000 per patient without any significant abdominal pain or vaginal bleeding. Will plan on giving patient prescription for Diclegis. She is following up with health Department tomorrow. Discussed return precautions.  ____________________________________________   FINAL CLINICAL IMPRESSION(S) / ED DIAGNOSES  Final diagnoses:  Pregnancy, unspecified gestational age     Note: This dictation was prepared with Nurse, children's dictation. Any transcriptional errors that result from this process are unintentional     Phineas Semen, MD 04/02/17 [phone]

## 2017-04-02 NOTE — ED Notes (Signed)
Pt tolerated oral intake well. Pt declined wheelchair, ambulatory without difficulty, steady gait noted.

## 2017-04-02 NOTE — Discharge Instructions (Signed)
Please seek medical attention for any high fevers, chest pain, shortness of breath, change in behavior, persistent vomiting, bloody stool or any other new or concerning symptoms.  

## 2017-04-14 ENCOUNTER — Other Ambulatory Visit: Payer: Self-pay | Admitting: Advanced Practice Midwife

## 2017-04-14 DIAGNOSIS — F819 Developmental disorder of scholastic skills, unspecified: Secondary | ICD-10-CM | POA: Insufficient documentation

## 2017-04-14 DIAGNOSIS — Z369 Encounter for antenatal screening, unspecified: Secondary | ICD-10-CM

## 2017-04-14 LAB — HM PAP SMEAR: HM Pap smear: NEGATIVE

## 2017-04-15 LAB — OB RESULTS CONSOLE RPR: RPR: NONREACTIVE

## 2017-04-15 LAB — OB RESULTS CONSOLE ANTIBODY SCREEN: Antibody Screen: NEGATIVE

## 2017-04-15 LAB — OB RESULTS CONSOLE ABO/RH: RH TYPE: POSITIVE

## 2017-04-15 LAB — OB RESULTS CONSOLE HEPATITIS B SURFACE ANTIGEN: HEP B S AG: NEGATIVE

## 2017-05-08 ENCOUNTER — Ambulatory Visit (HOSPITAL_BASED_OUTPATIENT_CLINIC_OR_DEPARTMENT_OTHER)
Admission: RE | Admit: 2017-05-08 | Discharge: 2017-05-08 | Disposition: A | Discharge status: Home / Self Care | Payer: Medicaid Other | Source: Ambulatory Visit | Attending: Maternal & Fetal Medicine | Admitting: Maternal & Fetal Medicine

## 2017-05-08 ENCOUNTER — Ambulatory Visit
Admission: RE | Admit: 2017-05-08 | Discharge: 2017-05-08 | Disposition: A | Discharge status: Home / Self Care | Payer: Medicaid Other | Source: Ambulatory Visit | Attending: Maternal & Fetal Medicine | Admitting: Maternal & Fetal Medicine

## 2017-05-08 ENCOUNTER — Ambulatory Visit: Payer: Self-pay

## 2017-05-08 VITALS — BP 113/63 | HR 105 | Temp 98.6°F | Resp 18 | Wt 131.8 lb

## 2017-05-08 DIAGNOSIS — Z3689 Encounter for other specified antenatal screening: Secondary | ICD-10-CM | POA: Diagnosis present

## 2017-05-08 DIAGNOSIS — Z3A12 12 weeks gestation of pregnancy: Secondary | ICD-10-CM | POA: Diagnosis not present

## 2017-05-08 DIAGNOSIS — Z369 Encounter for antenatal screening, unspecified: Secondary | ICD-10-CM

## 2017-05-08 NOTE — Progress Notes (Addendum)
[facility] Length of Consultation: 40 minutes   [name]  was referred to [facility] for genetic counseling to review prenatal screening and testing options.  This note summarizes the information we discussed.    We offered the following routine screening tests for this pregnancy:  First trimester screening, which includes nuchal translucency ultrasound screen and first trimester maternal serum marker screening.  The nuchal translucency has approximately an 80% detection rate for Down syndrome and can be positive for other chromosome abnormalities as well as congenital heart defects.  When combined with a maternal serum marker screening, the detection rate is up to 90% for Down syndrome and up to 97% for trisomy 18.     Maternal serum marker screening, a blood test that measures pregnancy proteins, can provide risk assessments for Down syndrome, trisomy 18, and open neural tube defects (spina bifida, anencephaly). Because it does not directly examine the fetus, it cannot positively diagnose or rule out these problems.  Targeted ultrasound uses high frequency sound waves to create an image of the developing fetus.  An ultrasound is often recommended as a routine means of evaluating the pregnancy.  It is also used to screen for fetal anatomy problems (for example, a heart defect) that might be suggestive of a chromosomal or other abnormality.   Should these screening tests indicate an increased concern, then the following additional testing options would be offered:  The chorionic villus sampling procedure is available for first trimester chromosome analysis.  This involves the withdrawal of a small amount of chorionic villi (tissue from the developing placenta).  Risk of pregnancy loss is estimated to be approximately 1 in 200 to 1 in 100 (0.5 to 1%).  There is approximately a 1% (1 in 100) chance that the CVS chromosome results will be unclear.  Chorionic  villi cannot be tested for neural tube defects.     Amniocentesis involves the removal of a small amount of amniotic fluid from the sac surrounding the fetus with the use of a thin needle inserted through the maternal abdomen and uterus.  Ultrasound guidance is used throughout the procedure.  Fetal cells from amniotic fluid are directly evaluated and > 99.5% of chromosome problems and > 98% of open neural tube defects can be detected. This procedure is generally performed after the 15th week of pregnancy.  The main risks to this procedure include complications leading to miscarriage in less than 1 in 200 cases (0.5%).  As another option for information if the pregnancy is suspected to be an an increased chance for certain chromosome conditions, we also reviewed the availability of cell free fetal DNA testing from maternal blood to determine whether or not the baby may have either Down syndrome, trisomy 103, or trisomy 22.  This test utilizes a maternal blood sample and DNA sequencing technology to isolate circulating cell free fetal DNA from maternal plasma.  The fetal DNA can then be analyzed for DNA sequences that are derived from the three most common chromosomes involved in aneuploidy, chromosomes 13, 18, and 21.  If the overall amount of DNA is greater than the expected level for any of these chromosomes, aneuploidy is suspected.  While we do not consider it a replacement for invasive testing and karyotype analysis, a negative result from this testing would be reassuring, though not a guarantee of a normal chromosome complement for the baby.  An abnormal result is certainly suggestive of an abnormal chromosome complement, though we would still recommend  CVS or amniocentesis to confirm any findings from this testing.  Cystic Fibrosis and Spinal Muscular Atrophy (SMA) screening were also discussed with the patient. Both conditions are recessive, which means that both parents must be carriers in order to have  a child with the disease.  Cystic fibrosis (CF) is one of the most common genetic conditions in persons of Caucasian ancestry.  This condition occurs in approximately 1 in 2,500 Caucasian persons and results in thickened secretions in the lungs, digestive, and reproductive systems.  For a baby to be at risk for having CF, both of the parents must be carriers for this condition.  Approximately 1 in 29 Caucasian persons is a carrier for CF.  Current carrier testing looks for the most common mutations in the gene for CF and can detect approximately 90% of carriers in the Caucasian population.  This means that the carrier screening can greatly reduce, but cannot eliminate, the chance for an individual to have a child with CF.  If an individual is found to be a carrier for CF, then carrier testing would be available for the partner. As part of [place]'s newborn screening profile, all babies born in the state of [place] will have a two-tier screening process.  Specimens are first tested to determine the concentration of immunoreactive trypsinogen (IRT).  The top 5% of specimens with the highest IRT values then undergo DNA testing using a panel of over 40 common CF mutations. SMA is a neurodegenerative disorder that leads to atrophy of skeletal muscle and overall weakness.  This condition is also more prevalent in the Caucasian population, with 1 in 40-1 in 60 persons being a carrier and 1 in 6,000-1 in 10,000 children being affected.  There are multiple forms of the disease, with some causing death in infancy to other forms with survival into adulthood.  The genetics of SMA is complex, but carrier screening can detect up to 95% of carriers in the Caucasian population.  Similar to CF, a negative result can greatly reduce, but cannot eliminate, the chance to have a child with SMA.Because the father of the pregnancy is reported to be of African American ancestry, we also offered the option of hemoglobinopathy  carrier testing.  We obtained a detailed family history and pregnancy history.  [name] reported that her paternal half sister has a son with autism.  He did not have any physical birth differences, and thriving and learning in a special school.  We reviewed that there may be many causes for autism including inherited factors such as Fragile X syndrome.  If no specific cause for his condition is known, it is difficult to provide an accurate recurrence risk estimate for other family members.  If more is learned, we are happy to review it further.  In the absence of a known diagnosis, we offered the option of carrier screening for Fragile X syndrome, which the patient declined.  The patient also reported that her sister is being evaluated for a possible bleeding condition due to abnormal lab studies and heavy periods.  The patient also reports heavy periods and easy bruising for herself, but no prolonged bleeding with dental work or injuries.  We encouraged her to speak with her doctor once her sister's results are available and follow up with hematology if needed.  Lastly, she has a maternal first cousin with cerebral palsy related to birth trauma.  If this is the cause, then other family members are not at risk for a similar  condition. The remainder of the family history was reported to be unremarkable for birth defects, intellectual delays, recurrent pregnancy loss or known chromosome abnormalities.  [name] stated that this is her first pregnancy.  She reported no complications or exposures that would be expected to increase the risk for birth defects.  After consideration of the options, [name] elected to proceed with an ultrasound for dating today.  She declined all other screening and testing options, as they would not change the outcome of the pregnancy for her.  An ultrasound was performed at the time of the visit.  The gestational age was consistent with  12 weeks.  Fetal anatomy could not be  assessed due to early gestational age.  Please refer to the ultrasound report for details of that study.  [name] was encouraged to call with questions or concerns.  We can be contacted at [phone].    Brandy Anderson, MS, CGC  I was immediately available and supervising. Argentina Ponder, MD [facility]

## 2017-06-16 ENCOUNTER — Other Ambulatory Visit: Payer: Self-pay | Admitting: *Deleted

## 2017-06-16 DIAGNOSIS — IMO0002 Reserved for concepts with insufficient information to code with codable children: Secondary | ICD-10-CM

## 2017-06-16 DIAGNOSIS — Z0489 Encounter for examination and observation for other specified reasons: Secondary | ICD-10-CM

## 2017-06-19 ENCOUNTER — Ambulatory Visit
Admission: RE | Admit: 2017-06-19 | Discharge: 2017-06-19 | Disposition: A | Discharge status: Home / Self Care | Payer: Medicaid Other | Source: Ambulatory Visit | Attending: Maternal & Fetal Medicine | Admitting: Maternal & Fetal Medicine

## 2017-06-19 DIAGNOSIS — Z363 Encounter for antenatal screening for malformations: Secondary | ICD-10-CM | POA: Insufficient documentation

## 2017-06-19 DIAGNOSIS — Z0489 Encounter for examination and observation for other specified reasons: Secondary | ICD-10-CM

## 2017-06-19 DIAGNOSIS — IMO0002 Reserved for concepts with insufficient information to code with codable children: Secondary | ICD-10-CM

## 2017-06-19 HISTORY — DX: Herpesviral infection, unspecified: B00.9

## 2017-06-28 ENCOUNTER — Encounter: Payer: Self-pay | Admitting: *Deleted

## 2017-06-28 ENCOUNTER — Observation Stay
Admission: EM | Admit: 2017-06-28 | Discharge: 2017-06-28 | Disposition: A | Discharge status: Home / Self Care | Payer: Medicaid Other | Attending: Obstetrics & Gynecology | Admitting: Obstetrics & Gynecology

## 2017-06-28 DIAGNOSIS — Z91040 Latex allergy status: Secondary | ICD-10-CM | POA: Diagnosis not present

## 2017-06-28 DIAGNOSIS — J45909 Unspecified asthma, uncomplicated: Secondary | ICD-10-CM | POA: Insufficient documentation

## 2017-06-28 DIAGNOSIS — Z79899 Other long term (current) drug therapy: Secondary | ICD-10-CM | POA: Diagnosis not present

## 2017-06-28 DIAGNOSIS — Z3A20 20 weeks gestation of pregnancy: Secondary | ICD-10-CM | POA: Insufficient documentation

## 2017-06-28 DIAGNOSIS — K219 Gastro-esophageal reflux disease without esophagitis: Secondary | ICD-10-CM | POA: Insufficient documentation

## 2017-06-28 DIAGNOSIS — O4692 Antepartum hemorrhage, unspecified, second trimester: Secondary | ICD-10-CM | POA: Diagnosis not present

## 2017-06-28 DIAGNOSIS — O99512 Diseases of the respiratory system complicating pregnancy, second trimester: Secondary | ICD-10-CM | POA: Insufficient documentation

## 2017-06-28 DIAGNOSIS — O99612 Diseases of the digestive system complicating pregnancy, second trimester: Secondary | ICD-10-CM | POA: Diagnosis not present

## 2017-06-28 LAB — CHLAMYDIA/NGC RT PCR (ARMC ONLY)
Chlamydia Tr: NOT DETECTED
N gonorrhoeae: NOT DETECTED

## 2017-06-28 LAB — WET PREP, GENITAL
Clue Cells Wet Prep HPF POC: NONE SEEN
SPERM: NONE SEEN
Trich, Wet Prep: NONE SEEN
Yeast Wet Prep HPF POC: NONE SEEN

## 2017-06-28 NOTE — Progress Notes (Signed)
Pt. left with mother prior to discharge. Mother had to take other daughter to work. CNM had been in with the family and discussed her discharge and would call if labs resulted as positive. Did not wait to go over discharge instructions and education. Elaina HoopsElks, Dotty Gonzalo S

## 2017-06-28 NOTE — OB Triage Note (Signed)
0700 this am bleeding noted. Last intercourse last night. Not having to wear a pad for bleeding. Notices bleeding when she wipes. Brandy HoopsElks, Brandy Ward

## 2017-06-28 NOTE — Discharge Summary (Signed)
Elita QuickHaley L Alf is a 21 y.o. female. She is at 8018w1d gestation. Patient's last menstrual period was 02/07/2017. Estimated Date of Delivery: 11/14/17  Prenatal care site: [facility]  Chief complaint: Vaginal bleeding  Location: vagina Onset/timing: 06/28/2017 at 0600 Duration: has continued since then Quality: red blood Severity: small to moderate in amount Aggravating or alleviating conditions: noted after intercourse last night Associated signs/symptoms: uterine cramping  Context: Patient reports that when she woke up this morning around 6am she noted blood on her thighs. It took 3-4 times wiping with toilet paper to clean up the blood. Since then, she has noted a small amount of red blood on her toilet paper when wiping after urination. She has not felt the need to wear a pad. She also reports that she has cramping on a regular basis, but feels like it is a little stronger today than it usually is. She states that she has only had one glass of soda to drink so far today and often has trouble staying adequately hydrated.   S: Resting comfortably. No CTX, scant VB, no LOF, positive fetal movement.   Maternal Medical History:   Past Medical History:  Diagnosis Date  . Asthma   . Frequent headaches   . GERD (gastroesophageal reflux disease)   . Herpes     Past Surgical History:  Procedure Laterality Date  . EYE SURGERY    . WISDOM TOOTH EXTRACTION      Allergies  Allergen Reactions  . Latex Dermatitis    condoms    Prior to Admission medications   Medication Sig Start Date End Date Taking? Authorizing Provider  albuterol (PROVENTIL HFA;VENTOLIN HFA) 108 (90 Base) MCG/ACT inhaler Inhale 2 puffs into the lungs every 6 (six) hours as needed for wheezing or shortness of breath.   Yes [provider]  cetirizine (ZYRTEC) 10 MG tablet Take 10 mg by mouth daily.   Yes [provider]  ondansetron (ZOFRAN ODT) 4 MG disintegrating tablet Take 1 tablet (4 mg total) by mouth  every 8 (eight) hours as needed for nausea or vomiting. 08/29/16  Yes Minna AntisPaduchowski, Kevin, MD  Prenatal Vit-Fe Fumarate-FA (PRENATAL MULTIVITAMIN) TABS tablet Take 1 tablet by mouth daily at 12 noon.   Yes [provider]  cetirizine-pseudoephedrine (ZYRTEC-D) 5-120 MG tablet Take 1 tablet by mouth 2 (two) times daily. Patient not taking: Reported on 05/08/2017 08/05/16   Menshew, Charlesetta IvoryJenise V Bacon, PA-C  Doxylamine-Pyridoxine 10-10 MG TBEC Take 1 tablet by mouth at bedtime. Patient not taking: Reported on 05/08/2017 04/02/17   Phineas SemenGoodman, Graydon, MD  omeprazole (PRILOSEC) 40 MG capsule Take 40 mg by mouth daily.    [provider]  traMADol (ULTRAM) 50 MG tablet Take 1 tablet (50 mg total) by mouth every 6 (six) hours as needed. Patient not taking: Reported on 05/08/2017 08/29/16   Minna AntisPaduchowski, Kevin, MD     Social History: She  reports that  has never smoked. she has never used smokeless tobacco. She reports that she does not drink alcohol or use drugs.  Family History: family history is not on file.  Review of Systems: A full review of systems was performed and negative except as noted in the HPI.    O:  BP (!) 111/56 (BP Location: Right Arm)   Pulse 89   Temp 98.8 F (37.1 C) (Oral)   Resp 18   Ht 5\' 5"  (1.651 m)   Wt 59.9 kg (132 lb)   LMP 02/07/2017   BMI 21.97 kg/m  No results found for this or any previous visit (from the past 48 hour(s)).   Constitutional: NAD, AAOx3  HE/ENT: extraocular movements grossly intact, moist mucous membranes CV: RRR PULM: nl respiratory effort, CTABL     Abd: gravid, non-tender, non-distended, soft      Ext: Non-tender, Nonedmeatous   Psych: mood appropriate, speech normal Pelvic: SSE: Cervix closed visually, no pooling of blood or fluid in the posterior vagina, small amount of blood tinged discharge present  A/P: 21 y.o. 3278w1d here for antenatal surveillance for vaginal bleeding  Labor: not present.   Fetal Wellbeing: Reassuring  FHTs in the 140s by doppler  R/o STI infection: cultures collected and sent for GC, CT, and trich  D/c home stable, precautions reviewed, follow-up as scheduled.   ----- Genia DelMargaret Maximos Zayas, CNM [facility], Department of OB/GYN [facility]

## 2017-08-22 LAB — HM HIV SCREENING LAB: HM HIV Screening: NEGATIVE

## 2017-08-26 ENCOUNTER — Other Ambulatory Visit: Payer: Self-pay

## 2017-08-26 ENCOUNTER — Encounter: Payer: Self-pay | Admitting: *Deleted

## 2017-08-26 ENCOUNTER — Observation Stay
Admission: EM | Admit: 2017-08-26 | Discharge: 2017-08-26 | Disposition: A | Discharge status: Home / Self Care | Payer: Medicaid Other | Attending: Obstetrics & Gynecology | Admitting: Obstetrics & Gynecology

## 2017-08-26 DIAGNOSIS — O26893 Other specified pregnancy related conditions, third trimester: Secondary | ICD-10-CM | POA: Diagnosis not present

## 2017-08-26 DIAGNOSIS — Z3A28 28 weeks gestation of pregnancy: Secondary | ICD-10-CM | POA: Insufficient documentation

## 2017-08-26 DIAGNOSIS — R102 Pelvic and perineal pain: Secondary | ICD-10-CM | POA: Diagnosis not present

## 2017-08-26 DIAGNOSIS — Z91040 Latex allergy status: Secondary | ICD-10-CM | POA: Insufficient documentation

## 2017-08-26 DIAGNOSIS — O133 Gestational [pregnancy-induced] hypertension without significant proteinuria, third trimester: Secondary | ICD-10-CM | POA: Diagnosis present

## 2017-08-26 DIAGNOSIS — Z79899 Other long term (current) drug therapy: Secondary | ICD-10-CM | POA: Diagnosis not present

## 2017-08-26 DIAGNOSIS — O26899 Other specified pregnancy related conditions, unspecified trimester: Secondary | ICD-10-CM | POA: Diagnosis not present

## 2017-08-26 DIAGNOSIS — K219 Gastro-esophageal reflux disease without esophagitis: Secondary | ICD-10-CM | POA: Diagnosis not present

## 2017-08-26 LAB — URINALYSIS, COMPLETE (UACMP) WITH MICROSCOPIC
BILIRUBIN URINE: NEGATIVE
Bacteria, UA: NONE SEEN
Glucose, UA: NEGATIVE mg/dL
Hgb urine dipstick: NEGATIVE
Ketones, ur: NEGATIVE mg/dL
LEUKOCYTES UA: NEGATIVE
Nitrite: NEGATIVE
PH: 8 (ref 5.0–8.0)
PROTEIN: NEGATIVE mg/dL
RBC / HPF: NONE SEEN RBC/hpf (ref 0–5)
Specific Gravity, Urine: 1.009 (ref 1.005–1.030)

## 2017-08-26 NOTE — OB Triage Note (Signed)
Patient states she has had lower abdomen/ side and back pain that moves to her pelvis for 3 days. Pt states it is achy and 8/10 that is constant. Denies any other concerns and states baby is moving well.

## 2017-08-28 LAB — URINE CULTURE

## 2017-09-04 NOTE — Discharge Summary (Signed)
Brandy Ward is a 22 y.o. female. She is at 657w4d gestation. Patient's last menstrual period was 02/07/2017. Estimated Date of Delivery: 11/14/17  Prenatal care site: [facility]  Chief complaint: low abdominal pain  Location: pelvis Onset/timing: constant Duration: 3 days Quality:  Unable to describe Severity: 8/10 per mom Aggravating or alleviating conditions: nothing has made better or worse, no interventions attempted. Associated signs/symptoms: no bleeding, fever, chills. Context: mother in room with patient and doing all of the talking.  States patient has had low bilateral pelvic pain for 3 days.  Patient does not deny or refute this.  No apparent trauma, + FM, no contractions, no dysuria, no odor to her urine, no bleeding or discharge.  Pain radiates down from sides to vagina/ vulva   Maternal Medical History:   Past Medical History:  Diagnosis Date  . Asthma   . Frequent headaches   . GERD (gastroesophageal reflux disease)   . Herpes     Past Surgical History:  Procedure Laterality Date  . EYE SURGERY    . WISDOM TOOTH EXTRACTION      Allergies  Allergen Reactions  . Latex Dermatitis    condoms    Prior to Admission medications   Medication Sig Start Date End Date Taking? Authorizing Provider  acetaminophen (TYLENOL) 325 MG tablet Take 650 mg every 6 (six) hours as needed by mouth for headache.   Yes [provider]  albuterol (PROVENTIL HFA;VENTOLIN HFA) 108 (90 Base) MCG/ACT inhaler Inhale 2 puffs into the lungs every 6 (six) hours as needed for wheezing or shortness of breath.   Yes [provider]  Prenatal Vit-Fe Fumarate-FA (PRENATAL MULTIVITAMIN) TABS tablet Take 1 tablet by mouth daily at 12 noon.   Yes [provider]  cetirizine (ZYRTEC) 10 MG tablet Take 10 mg by mouth daily.    [provider]  cetirizine-pseudoephedrine (ZYRTEC-D) 5-120 MG tablet Take 1 tablet by mouth 2 (two) times daily. Patient not taking: Reported  on 05/08/2017 08/05/16   Menshew, Charlesetta IvoryJenise V Bacon, PA-C  Doxylamine-Pyridoxine 10-10 MG TBEC Take 1 tablet by mouth at bedtime. Patient not taking: Reported on 05/08/2017 04/02/17   Phineas SemenGoodman, Graydon, MD  omeprazole (PRILOSEC) 40 MG capsule Take 40 mg by mouth daily.    [provider]  ondansetron (ZOFRAN ODT) 4 MG disintegrating tablet Take 1 tablet (4 mg total) by mouth every 8 (eight) hours as needed for nausea or vomiting. Patient not taking: Reported on 08/26/2017 08/29/16   Minna AntisPaduchowski, Kevin, MD  traMADol (ULTRAM) 50 MG tablet Take 1 tablet (50 mg total) by mouth every 6 (six) hours as needed. Patient not taking: Reported on 05/08/2017 08/29/16   Minna AntisPaduchowski, Kevin, MD     Social History: She  reports that  has never smoked. she has never used smokeless tobacco. She reports that she does not drink alcohol or use drugs.  Family History: family history includes Diabetes in her maternal grandmother; Hypertension in her mother.   Review of Systems: A full review of systems was performed and negative except as noted in the HPI.     O:  LMP 02/07/2017  No results found for this or any previous visit (from the past 48 hour(s)).   Constitutional: NAD, AAOx3  HE/ENT: extraocular movements grossly intact, moist mucous membranes CV: RRR PULM: nl respiratory effort, CTABL     Abd: gravid, non-tender, non-distended, soft Back: non tender, no CVA ttp.     Ext: Non-tender, Nonedmeatous   Psych: mood appropriate, speech  normal Pelvic deferred  NST:  Baseline: 145 Variability: moderate Accelerations present x >2 Decelerations absent Time    A/P: 22 y.o. [redacted]w[redacted]d here for pelvic pain  Labor: not present.   Fetal Wellbeing: Reassuring Cat 1 tracing.  Reactive NST   distribution consistent with round ligament pain.  Heat/ice and tylenol PRN.  Notify prenatal care provider if not improved.  D/c home stable, precautions reviewed, follow-up as scheduled.   ----- Ranae Plumber, MD Attending Obstetrician and Gynecologist [facility], Department of OB/GYN [facility]

## 2017-10-05 ENCOUNTER — Other Ambulatory Visit: Payer: Self-pay

## 2017-10-05 ENCOUNTER — Encounter
Admission: EM | Disposition: A | Discharge status: Home / Self Care | Payer: Self-pay | Source: Ambulatory Visit | Attending: Obstetrics and Gynecology

## 2017-10-05 ENCOUNTER — Encounter: Payer: Self-pay | Admitting: *Deleted

## 2017-10-05 ENCOUNTER — Inpatient Hospital Stay
Admission: EM | Admit: 2017-10-05 | Discharge: 2017-10-08 | DRG: 787 | Disposition: A | Discharge status: Home / Self Care | Payer: Medicaid Other | Source: Ambulatory Visit | Attending: Obstetrics and Gynecology | Admitting: Obstetrics and Gynecology

## 2017-10-05 ENCOUNTER — Inpatient Hospital Stay: Payer: Medicaid Other | Admitting: Registered Nurse

## 2017-10-05 DIAGNOSIS — O9952 Diseases of the respiratory system complicating childbirth: Secondary | ICD-10-CM | POA: Diagnosis present

## 2017-10-05 DIAGNOSIS — J45909 Unspecified asthma, uncomplicated: Secondary | ICD-10-CM | POA: Diagnosis present

## 2017-10-05 DIAGNOSIS — A6000 Herpesviral infection of urogenital system, unspecified: Secondary | ICD-10-CM | POA: Diagnosis present

## 2017-10-05 DIAGNOSIS — Z3490 Encounter for supervision of normal pregnancy, unspecified, unspecified trimester: Secondary | ICD-10-CM

## 2017-10-05 DIAGNOSIS — O9962 Diseases of the digestive system complicating childbirth: Secondary | ICD-10-CM | POA: Diagnosis present

## 2017-10-05 DIAGNOSIS — Z3A34 34 weeks gestation of pregnancy: Secondary | ICD-10-CM

## 2017-10-05 DIAGNOSIS — O4692 Antepartum hemorrhage, unspecified, second trimester: Secondary | ICD-10-CM

## 2017-10-05 DIAGNOSIS — O99824 Streptococcus B carrier state complicating childbirth: Secondary | ICD-10-CM | POA: Diagnosis present

## 2017-10-05 DIAGNOSIS — K219 Gastro-esophageal reflux disease without esophagitis: Secondary | ICD-10-CM | POA: Diagnosis present

## 2017-10-05 DIAGNOSIS — O9832 Other infections with a predominantly sexual mode of transmission complicating childbirth: Secondary | ICD-10-CM | POA: Diagnosis present

## 2017-10-05 DIAGNOSIS — O42913 Preterm premature rupture of membranes, unspecified as to length of time between rupture and onset of labor, third trimester: Secondary | ICD-10-CM | POA: Diagnosis present

## 2017-10-05 HISTORY — DX: Nausea with vomiting, unspecified: Z98.890

## 2017-10-05 HISTORY — DX: Nausea with vomiting, unspecified: R11.2

## 2017-10-05 LAB — CBC
HCT: 38 % (ref 35.0–47.0)
Hemoglobin: 13.1 g/dL (ref 12.0–16.0)
MCH: 31.6 pg (ref 26.0–34.0)
MCHC: 34.4 g/dL (ref 32.0–36.0)
MCV: 91.9 fL (ref 80.0–100.0)
PLATELETS: 255 10*3/uL (ref 150–440)
RBC: 4.14 MIL/uL (ref 3.80–5.20)
RDW: 12.5 % (ref 11.5–14.5)
WBC: 14 10*3/uL — ABNORMAL HIGH (ref 3.6–11.0)

## 2017-10-05 LAB — TYPE AND SCREEN
ABO/RH(D): O POS
Antibody Screen: NEGATIVE

## 2017-10-05 SURGERY — Surgical Case
Anesthesia: Spinal | Site: Abdomen | Wound class: Clean Contaminated

## 2017-10-05 MED ORDER — SODIUM CHLORIDE 0.9 % IJ SOLN
INTRAMUSCULAR | Status: AC
  Filled 2017-10-05: qty 50

## 2017-10-05 MED ORDER — LACTATED RINGERS IV SOLN
INTRAVENOUS | Status: DC
  Administered 2017-10-06: 01:00:00 via INTRAVENOUS

## 2017-10-05 MED ORDER — KETOROLAC TROMETHAMINE 30 MG/ML IJ SOLN
INTRAMUSCULAR | Status: AC
  Filled 2017-10-05: qty 1

## 2017-10-05 MED ORDER — COCONUT OIL OIL: 1.0000 "application " | TOPICAL_OIL | Status: DC | PRN

## 2017-10-05 MED ORDER — FLEET ENEMA 7-19 GM/118ML RE ENEM: 1.0000 | ENEMA | Freq: Every day | RECTAL | Status: DC | PRN

## 2017-10-05 MED ORDER — KETOROLAC TROMETHAMINE 30 MG/ML IJ SOLN
30.0000 mg | Freq: Once | INTRAMUSCULAR | Status: AC
  Administered 2017-10-05: 30 mg via INTRAVENOUS

## 2017-10-05 MED ORDER — CEFAZOLIN SODIUM-DEXTROSE 2-4 GM/100ML-% IV SOLN
2.0000 g | INTRAVENOUS | Status: AC
  Administered 2017-10-05: 2 g via INTRAVENOUS
  Filled 2017-10-05: qty 100

## 2017-10-05 MED ORDER — SODIUM CHLORIDE 0.9 % IV SOLN
500.0000 mg | INTRAVENOUS | Status: DC
  Administered 2017-10-05 – 2017-10-06 (×2): 500 mg via INTRAVENOUS
  Filled 2017-10-05 (×5): qty 500

## 2017-10-05 MED ORDER — PHENYLEPHRINE HCL 10 MG/ML IJ SOLN
INTRAMUSCULAR | Status: DC | PRN
  Administered 2017-10-05 (×2): 100 ug via INTRAVENOUS

## 2017-10-05 MED ORDER — ONDANSETRON HCL 4 MG/2ML IJ SOLN
INTRAMUSCULAR | Status: DC | PRN
  Administered 2017-10-05: 4 mg via INTRAVENOUS

## 2017-10-05 MED ORDER — BUPIVACAINE IN DEXTROSE 0.75-8.25 % IT SOLN
INTRATHECAL | Status: DC | PRN
  Administered 2017-10-05: 1.75 mL via INTRATHECAL

## 2017-10-05 MED ORDER — BISACODYL 10 MG RE SUPP: 10.0000 mg | Freq: Every day | RECTAL | Status: DC | PRN

## 2017-10-05 MED ORDER — SIMETHICONE 80 MG PO CHEW
80.0000 mg | CHEWABLE_TABLET | Freq: Three times a day (TID) | ORAL | Status: DC
  Administered 2017-10-06 – 2017-10-08 (×8): 80 mg via ORAL
  Filled 2017-10-05 (×6): qty 1

## 2017-10-05 MED ORDER — EPINEPHRINE PF 1 MG/10ML IJ SOSY
PREFILLED_SYRINGE | INTRAMUSCULAR | Status: DC | PRN
  Administered 2017-10-05: .0001 ug via INTRAVENOUS

## 2017-10-05 MED ORDER — PROPOFOL 10 MG/ML IV BOLUS
INTRAVENOUS | Status: AC
Start: 2017-10-05 — End: 2017-10-05
  Filled 2017-10-05: qty 20

## 2017-10-05 MED ORDER — OXYTOCIN 40 UNITS IN LACTATED RINGERS INFUSION - SIMPLE MED
2.5000 [IU]/h | INTRAVENOUS | Status: AC
  Administered 2017-10-05: 2.5 [IU]/h via INTRAVENOUS

## 2017-10-05 MED ORDER — FENTANYL CITRATE (PF) 100 MCG/2ML IJ SOLN
INTRAMUSCULAR | Status: AC
  Filled 2017-10-05: qty 2

## 2017-10-05 MED ORDER — BUPIVACAINE HCL (PF) 0.5 % IJ SOLN
INTRAMUSCULAR | Status: AC
  Filled 2017-10-05: qty 30

## 2017-10-05 MED ORDER — WITCH HAZEL-GLYCERIN EX PADS: 1.0000 "application " | MEDICATED_PAD | CUTANEOUS | Status: DC | PRN

## 2017-10-05 MED ORDER — LACTATED RINGERS IV SOLN
INTRAVENOUS | Status: DC | PRN
  Administered 2017-10-05: 17:00:00 via INTRAVENOUS

## 2017-10-05 MED ORDER — ONDANSETRON HCL 4 MG/2ML IJ SOLN: 4.0000 mg | Freq: Once | INTRAMUSCULAR | Status: DC | PRN

## 2017-10-05 MED ORDER — FENTANYL CITRATE (PF) 100 MCG/2ML IJ SOLN
INTRAMUSCULAR | Status: DC | PRN
  Administered 2017-10-05: 100 ug via INTRAVENOUS

## 2017-10-05 MED ORDER — OXYTOCIN 40 UNITS IN LACTATED RINGERS INFUSION - SIMPLE MED
INTRAVENOUS | Status: AC
  Filled 2017-10-05: qty 1000

## 2017-10-05 MED ORDER — OXYCODONE-ACETAMINOPHEN 5-325 MG PO TABS
2.0000 | ORAL_TABLET | ORAL | Status: DC | PRN
  Administered 2017-10-05 – 2017-10-08 (×4): 2 via ORAL
  Filled 2017-10-05 (×3): qty 2

## 2017-10-05 MED ORDER — DIPHENHYDRAMINE HCL 25 MG PO CAPS: 25.0000 mg | ORAL_CAPSULE | Freq: Four times a day (QID) | ORAL | Status: DC | PRN

## 2017-10-05 MED ORDER — MENTHOL 3 MG MT LOZG
1.0000 | LOZENGE | OROMUCOSAL | Status: DC | PRN
  Filled 2017-10-05: qty 9

## 2017-10-05 MED ORDER — SUCCINYLCHOLINE CHLORIDE 20 MG/ML IJ SOLN
INTRAMUSCULAR | Status: AC
  Filled 2017-10-05: qty 1

## 2017-10-05 MED ORDER — SENNOSIDES-DOCUSATE SODIUM 8.6-50 MG PO TABS
2.0000 | ORAL_TABLET | ORAL | Status: DC
  Administered 2017-10-06 – 2017-10-08 (×3): 2 via ORAL
  Filled 2017-10-05 (×3): qty 2

## 2017-10-05 MED ORDER — ACETAMINOPHEN 325 MG PO TABS
650.0000 mg | ORAL_TABLET | ORAL | Status: DC | PRN
Start: 2017-10-05 — End: 2017-10-08

## 2017-10-05 MED ORDER — BUPIVACAINE LIPOSOME 1.3 % IJ SUSP
INTRAMUSCULAR | Status: DC | PRN
  Administered 2017-10-05: 60 mL

## 2017-10-05 MED ORDER — SIMETHICONE 80 MG PO CHEW
80.0000 mg | CHEWABLE_TABLET | ORAL | Status: DC
  Filled 2017-10-05 (×2): qty 1

## 2017-10-05 MED ORDER — OXYTOCIN 40 UNITS IN LACTATED RINGERS INFUSION - SIMPLE MED
INTRAVENOUS | Status: DC | PRN
  Administered 2017-10-05: 500 mL via INTRAVENOUS

## 2017-10-05 MED ORDER — BUPIVACAINE LIPOSOME 1.3 % IJ SUSP
INTRAMUSCULAR | Status: AC
  Filled 2017-10-05: qty 20

## 2017-10-05 MED ORDER — SOD CITRATE-CITRIC ACID 500-334 MG/5ML PO SOLN
30.0000 mL | ORAL | Status: AC
  Administered 2017-10-05: 30 mL via ORAL
  Filled 2017-10-05: qty 15

## 2017-10-05 MED ORDER — PRENATAL MULTIVITAMIN CH
1.0000 | ORAL_TABLET | Freq: Every day | ORAL | Status: DC
  Administered 2017-10-06 – 2017-10-08 (×3): 1 via ORAL
  Filled 2017-10-05 (×3): qty 1

## 2017-10-05 MED ORDER — LIDOCAINE HCL (PF) 2 % IJ SOLN
INTRAMUSCULAR | Status: AC
  Filled 2017-10-05: qty 10

## 2017-10-05 MED ORDER — IBUPROFEN 600 MG PO TABS
600.0000 mg | ORAL_TABLET | Freq: Four times a day (QID) | ORAL | Status: DC
  Administered 2017-10-06 – 2017-10-08 (×11): 600 mg via ORAL
  Filled 2017-10-05 (×12): qty 1

## 2017-10-05 MED ORDER — MEASLES, MUMPS & RUBELLA VAC ~~LOC~~ INJ
0.5000 mL | INJECTION | Freq: Once | SUBCUTANEOUS | Status: DC
  Filled 2017-10-05: qty 0.5

## 2017-10-05 MED ORDER — FENTANYL CITRATE (PF) 100 MCG/2ML IJ SOLN
25.0000 ug | INTRAMUSCULAR | Status: DC | PRN
  Administered 2017-10-05: 25 ug via INTRAVENOUS

## 2017-10-05 MED ORDER — BUPIVACAINE HCL (PF) 0.25 % IJ SOLN
INTRAMUSCULAR | Status: DC | PRN
  Administered 2017-10-05: 30 mL

## 2017-10-05 MED ORDER — ONDANSETRON HCL 4 MG/2ML IJ SOLN
INTRAMUSCULAR | Status: AC
  Filled 2017-10-05: qty 2

## 2017-10-05 MED ORDER — OXYCODONE-ACETAMINOPHEN 5-325 MG PO TABS
1.0000 | ORAL_TABLET | ORAL | Status: DC | PRN
  Administered 2017-10-06 – 2017-10-08 (×9): 1 via ORAL
  Filled 2017-10-05 (×11): qty 1

## 2017-10-05 MED ORDER — TETANUS-DIPHTH-ACELL PERTUSSIS 5-2.5-18.5 LF-MCG/0.5 IM SUSP: 0.5000 mL | Freq: Once | INTRAMUSCULAR | Status: DC

## 2017-10-05 MED ORDER — SIMETHICONE 80 MG PO CHEW: 80.0000 mg | CHEWABLE_TABLET | ORAL | Status: DC | PRN

## 2017-10-05 MED ORDER — DIBUCAINE 1 % RE OINT: 1.0000 "application " | TOPICAL_OINTMENT | RECTAL | Status: DC | PRN

## 2017-10-05 SURGICAL SUPPLY — 25 items
CANISTER SUCT 3000ML PPV (MISCELLANEOUS) ×3 IMPLANT
CHLORAPREP W/TINT 26ML (MISCELLANEOUS) ×3 IMPLANT
DRSG OPSITE POSTOP 4X10 (GAUZE/BANDAGES/DRESSINGS) ×3 IMPLANT
DRSG TELFA 3X8 NADH (GAUZE/BANDAGES/DRESSINGS) ×3 IMPLANT
ELECT REM PT RETURN 9FT ADLT (ELECTROSURGICAL) ×3
ELECTRODE REM PT RTRN 9FT ADLT (ELECTROSURGICAL) ×1 IMPLANT
GAUZE SPONGE 4X4 12PLY STRL (GAUZE/BANDAGES/DRESSINGS) ×3 IMPLANT
GOWN STRL REUS W/ TWL LRG LVL3 (GOWN DISPOSABLE) ×3 IMPLANT
GOWN STRL REUS W/TWL LRG LVL3 (GOWN DISPOSABLE) ×6
NEEDLE HYPO 22GX1.5 SAFETY (NEEDLE) ×3 IMPLANT
NS IRRIG 1000ML POUR BTL (IV SOLUTION) ×3 IMPLANT
PAD OB MATERNITY 4.3X12.25 (PERSONAL CARE ITEMS) ×3 IMPLANT
PAD PREP 24X41 OB/GYN DISP (PERSONAL CARE ITEMS) ×3 IMPLANT
STAPLER INSORB 30 2030 C-SECTI (MISCELLANEOUS) ×3 IMPLANT
SUT MNCRL 4-0 (SUTURE)
SUT MNCRL 4-0 27XMFL (SUTURE)
SUT PDS AB 1 TP1 96 (SUTURE) ×3 IMPLANT
SUT PLAIN 2 0 XLH (SUTURE) ×3 IMPLANT
SUT PLAIN GUT 2-0 30 C14 SG823 (SUTURE) ×3
SUT VIC AB 0 CT1 36 (SUTURE) ×9 IMPLANT
SUT VIC AB 3-0 SH 27 (SUTURE) ×2
SUT VIC AB 3-0 SH 27X BRD (SUTURE) ×1 IMPLANT
SUTURE MNCRL 4-0 27XMF (SUTURE) IMPLANT
SUTURE PLN GUT2-0 30 C14 SG823 (SUTURE) ×1 IMPLANT
SYR 30ML LL (SYRINGE) ×3 IMPLANT

## 2017-10-05 NOTE — Lactation Note (Signed)
This note was copied from a baby's chart. Lactation Consultation Note  Patient Name: Brandy Kasandra KnudsenHaley Newman EAVWU'JToday's Date: 10/05/2017  Mom's original plan was to bottle feed formula.  Explained alternatives if wanted to pump and supply [name]  Discussed advantages of supply healthy breast milk for her 34.2 week baby.  Mom declines stating, "I really just want to stick with original plan of giving formula."  Encouraged mom to call for lactation assistance if she changed her mind.    Maternal Data    Feeding    LATCH Score                   Interventions    Lactation Tools Discussed/Used     Consult Status      Louis MeckelWilliams, Giuseppe Duchemin Kay 10/05/2017, 11:09 PM

## 2017-10-05 NOTE — Discharge Summary (Signed)
Obstetrical Discharge Summary  Patient Name: Brandy Ward DOB: Apr 05, 1996 MRN: [id number]  Date of Admission: 10/05/2017 Date of Discharge: 10/08/2017  Primary OB: [facility]  Gestational Age at Delivery: [redacted]w[redacted]d   Antepartum complications:  - Hx of ringworm, head lice, HSV - high risk social situations: completed 10th grade - migraines - asthma - uses albuterol PRN   Admitting Diagnosis: PPROM Secondary Diagnosis: Active genital HSV  Patient Active Problem List   Diagnosis Date Noted  . Pregnancy 10/05/2017  . Preterm premature rupture of membranes in third trimester 10/05/2017  . Labor and delivery indication for care or intervention 08/26/2017  . Vaginal bleeding in pregnancy, second trimester 06/28/2017  . Labor and delivery, indication for care 06/28/2017  . First trimester screening     Intrapartum complications/course:  pLTCS 10/05/17 Date of Delivery: 10/05/16 Delivered By: Brandy Ward  Delivery Type: primary cesarean section, low transverse incision Anesthesia: spinal Episiotomy: none Newborn Data: Live born female  "Brandy Ward" Birth Weight:  4#7oz APGAR: 7,8  Newborn Delivery   Birth date/time:  10/05/2017 17:39:00 Delivery type:  C-Section, low transverse C-section categorization:  Primary     Discharge Physical Exam:  BP 113/61 (BP Location: Left Arm)   Pulse 85   Temp 97.8 F (36.6 C) (Oral)   Resp 18   Ht 5\' 6"  (1.676 m)   Wt 71.2 kg (157 lb)   LMP 02/07/2017   SpO2 100%   BMI 25.34 kg/m   General: NAD CV: RRR Pulm: CTABL, nl effort ABD: s/nd/nt, fundus firm and below the umbilicus Lochia: moderate Incision: c/d/i  DVT Evaluation: LE non-ttp, no evidence of DVT on exam.  Hemoglobin  Date Value Ref Range Status  10/06/2017 12.0 12.0 - 16.0 g/dL Final   HGB  Date Value Ref Range Status  02/22/2014 14.2 12.0 - 16.0 g/dL Final   HCT  Date Value Ref Range Status  10/06/2017 33.7 (L) 35.0 - 47.0 % Final  02/22/2014 42.5 35.0 - 47.0 % Final     Post partum course: uncomplicated Postpartum Procedures: none Disposition: stable, discharge to home. Baby Feeding: formula Baby Disposition: special care nursery  Rh Immune globulin given: Rubella vaccine given:  Tdap vaccine given in AP or PP setting: antepartum Flu vaccine given in AP or PP setting: - declined  Contraception: nexplanon  Prenatal Labs:   Blood type/Rh O positive  Antibody screen neg  Rubella Immune  Varicella Immune  RPR NR  HBsAg Neg  HIV NR  GC neg  Chlamydia neg  Genetic screening negative  1 hour GTT 74  3 hour GTT n/a  GBS positive      Plan:  Brandy Ward was discharged to home in good condition. Follow-up appointment at [facility] 2 weeks   Discharge Medications: Allergies as of 10/08/2017      Reactions   Latex Dermatitis   condoms      Medication List    STOP taking these medications   acetaminophen 325 MG tablet Commonly known as:  TYLENOL   traMADol 50 MG tablet Commonly known as:  ULTRAM     TAKE these medications   albuterol 108 (90 Base) MCG/ACT inhaler Commonly known as:  PROVENTIL HFA;VENTOLIN HFA Inhale 2 puffs into the lungs every 6 (six) hours as needed for wheezing or shortness of breath.   cetirizine 10 MG tablet Commonly known as:  ZYRTEC Take 10 mg by mouth daily.   cetirizine-pseudoephedrine 5-120 MG tablet Commonly known as:  ZYRTEC-D Take 1 tablet  by mouth 2 (two) times daily.   docusate sodium 100 MG capsule Commonly known as:  COLACE Take 1 capsule (100 mg total) by mouth 2 (two) times daily for 14 days. To keep stools soft, as needed   Doxylamine-Pyridoxine 10-10 MG Tbec Take 1 tablet by mouth at bedtime.   ferrous sulfate 325 (65 FE) MG tablet Take 1 tablet (325 mg total) by mouth daily with breakfast. Take with Vitamin C   ibuprofen 800 MG tablet Commonly known as:  ADVIL,MOTRIN Take 1 tablet (800 mg total) by mouth every 8 (eight) hours as needed for moderate pain or  cramping.   omeprazole 40 MG capsule Commonly known as:  PRILOSEC Take 40 mg by mouth daily.   ondansetron 4 MG disintegrating tablet Commonly known as:  ZOFRAN ODT Take 1 tablet (4 mg total) by mouth every 8 (eight) hours as needed for nausea or vomiting.   oxyCODONE-acetaminophen 5-325 MG tablet Commonly known as:  PERCOCET/ROXICET Take 1-2 tablets by mouth every 6 (six) hours as needed for severe pain.   prenatal multivitamin Tabs tablet Take 1 tablet by mouth daily at 12 noon.   promethazine 25 MG tablet Commonly known as:  PHENERGAN Take 25 mg by mouth every 6 (six) hours as needed for nausea or vomiting.       Follow-up Information    Brandy DouglasBeasley, Brandy Luckett, MD In 2 weeks.   Specialty:  Obstetrics and Gynecology Why:  For postop check Contact information: [address] [place] [address] [phone]           Signed: Christeen DouglasBethany Shiesha Ward 10/08/17

## 2017-10-05 NOTE — Discharge Instructions (Signed)

## 2017-10-05 NOTE — Anesthesia Preprocedure Evaluation (Addendum)
Anesthesia Evaluation  Patient identified by MRN, date of birth, ID band  History of Anesthesia Complications (+) PONV and history of anesthetic complications  Airway Mallampati: II  TM Distance: >3 FB     Dental  (+) Teeth Intact   Pulmonary asthma ,    Pulmonary exam normal        Cardiovascular negative cardio ROS Normal cardiovascular exam     Neuro/Psych  Headaches, negative psych ROS   GI/Hepatic GERD  Medicated,  Endo/Other  negative endocrine ROS  Renal/GU negative Renal ROS  negative genitourinary   Musculoskeletal negative musculoskeletal ROS (+)   Abdominal Normal abdominal exam  (+)   Peds negative pediatric ROS (+)  Hematology negative hematology ROS (+)   Anesthesia Other Findings Past Medical History: No date: Asthma No date: Frequent headaches     Comment:  migraine No date: GERD (gastroesophageal reflux disease) No date: Herpes No date: PONV (postoperative nausea and vomiting)  Reproductive/Obstetrics (+) Pregnancy                             Anesthesia Physical Anesthesia Plan  ASA: II and emergent  Anesthesia Plan: Spinal   Post-op Pain Management:    Induction: Intravenous  PONV Risk Score and Plan:   Airway Management Planned: Nasal Cannula  Additional Equipment:   Intra-op Plan:   Post-operative Plan:   Informed Consent: I have reviewed the patients History and Physical, chart, labs and discussed the procedure including the risks, benefits and alternatives for the proposed anesthesia with the patient or authorized representative who has indicated his/her understanding and acceptance.   Dental advisory given  Plan Discussed with: CRNA and Surgeon  Anesthesia Plan Comments: (Patient has a secondary Herpes infection, so regional block is an option.)       Anesthesia Quick Evaluation

## 2017-10-05 NOTE — Plan of Care (Signed)
Pt. Transferred to room 351. Alert and oriented. Assessment and room orientation completed by E. Mathewson Charity fundraiserN. Teaching initiated as well as [place] Policy, Pt. V/O.

## 2017-10-05 NOTE — Anesthesia Procedure Notes (Signed)
Spinal  Patient location during procedure: OR Start time: 10/05/2017 5:11 PM End time: 10/05/2017 5:14 PM Staffing Anesthesiologist: Alvin Critchley, MD Resident/CRNA: Johnna Acosta, CRNA Performed: resident/CRNA  Preanesthetic Checklist Completed: patient identified, site marked, surgical consent, pre-op evaluation, timeout performed, IV checked, risks and benefits discussed and monitors and equipment checked Spinal Block Patient position: sitting Prep: ChloraPrep Patient monitoring: heart rate, continuous pulse ox, blood pressure and cardiac monitor Approach: midline Location: L4-5 Injection technique: single-shot Needle Needle type: Whitacre and Introducer  Needle gauge: 24 G Needle length: 9 cm Assessment Sensory level: T10 Additional Notes Negative paresthesia. Negative blood return. Positive free-flowing CSF. Expiration date of kit checked and confirmed. Patient tolerated procedure well, without complications.

## 2017-10-05 NOTE — Anesthesia Post-op Follow-up Note (Signed)
Anesthesia QCDR form completed.        

## 2017-10-05 NOTE — Transfer of Care (Signed)
Immediate Anesthesia Transfer of Care Note  Patient: Brandy Ward  Procedure(s) Performed: CESAREAN SECTION (N/A Abdomen)  Patient Location: PACU  Anesthesia Type:General  Level of Consciousness: awake, alert  and oriented  Airway & Oxygen Therapy: Patient Spontanous Breathing  Post-op Assessment: Report given to RN and Post -op Vital signs reviewed and stable  Post vital signs: Reviewed and stable  Last Vitals:  Vitals:   10/05/17 1628 10/05/17 1829  BP: (!) 139/92 (!) 126/58  Pulse: (!) 102 97  Resp:  16  Temp:  36.4 C  SpO2:  100%    Last Pain:  Vitals:   10/05/17 1829  TempSrc: Oral  PainSc:          Complications: No apparent anesthesia complications

## 2017-10-05 NOTE — Anesthesia Procedure Notes (Signed)
Date/Time: 10/05/2017 5:08 PM Performed by: Ginger CarneMichelet, Dahlila Pfahler, CRNA Pre-anesthesia Checklist: Patient identified, Emergency Drugs available, Suction available, Patient being monitored and Timeout performed Patient Re-evaluated:Patient Re-evaluated prior to induction Oxygen Delivery Method: Nasal cannula Preoxygenation: Pre-oxygenation with 100% oxygen

## 2017-10-05 NOTE — OB Triage Note (Signed)
Patient to Obs 2 for leaking of fluid since 230pm this afternoon clear-white colored. She reports occasional tightening of her belly. Yellow colored snotty discharge for 2 weeks.

## 2017-10-05 NOTE — H&P (Signed)
OB ADMISSION/ HISTORY & PHYSICAL:  Admission Date: 10/05/2017  2:58 PM  Admit Diagnosis: PPROM  Brandy Ward is a 22 y.o. female presenting for  PPROM at 34+2wks with an active HSV lesion, NOT primary outbreak, on her right labia minora.  Ferning, pooling and nitrazine positive. Pt herself born at 11wks. GBS positive  Prenatal History: G1P0000   EDC : 11/14/2017, by Last Menstrual Period  Prenatal care at [facility] Prenatal course complicated by  - Hx of ringworm, head lice, HSV - high risk social situations: completed 10th grade - migraines - asthma - uses albuterol PRN  Medical / Surgical History :  Past medical history:  Past Medical History:  Diagnosis Date  . Asthma   . Frequent headaches    migraine  . GERD (gastroesophageal reflux disease)   . Herpes   . PONV (postoperative nausea and vomiting)      Past surgical history:  Past Surgical History:  Procedure Laterality Date  . EYE SURGERY    . WISDOM TOOTH EXTRACTION      Family History:  Family History  Problem Relation Age of Onset  . Hypertension Mother   . Diabetes Maternal Grandmother      Social History:  reports that  has never smoked. she has never used smokeless tobacco. She reports that she does not drink alcohol or use drugs.   Allergies: Latex    Current Medications at time of admission:  Prior to Admission medications   Medication Sig Start Date End Date Taking? Authorizing Provider  acetaminophen (TYLENOL) 325 MG tablet Take 650 mg every 6 (six) hours as needed by mouth for headache.   Yes [provider]  albuterol (PROVENTIL HFA;VENTOLIN HFA) 108 (90 Base) MCG/ACT inhaler Inhale 2 puffs into the lungs every 6 (six) hours as needed for wheezing or shortness of breath.   Yes [provider]  Prenatal Vit-Fe Fumarate-FA (PRENATAL MULTIVITAMIN) TABS tablet Take 1 tablet by mouth daily at 12 noon.   Yes [provider]  promethazine (PHENERGAN) 25 MG tablet Take 25 mg by  mouth every 6 (six) hours as needed for nausea or vomiting.   Yes [provider]  cetirizine (ZYRTEC) 10 MG tablet Take 10 mg by mouth daily.    [provider]  cetirizine-pseudoephedrine (ZYRTEC-D) 5-120 MG tablet Take 1 tablet by mouth 2 (two) times daily. Patient not taking: Reported on 05/08/2017 08/05/16   Menshew, Charlesetta Ivory, PA-C  Doxylamine-Pyridoxine 10-10 MG TBEC Take 1 tablet by mouth at bedtime. Patient not taking: Reported on 05/08/2017 04/02/17   Phineas Semen, MD  omeprazole (PRILOSEC) 40 MG capsule Take 40 mg by mouth daily.    [provider]  ondansetron (ZOFRAN ODT) 4 MG disintegrating tablet Take 1 tablet (4 mg total) by mouth every 8 (eight) hours as needed for nausea or vomiting. Patient not taking: Reported on 08/26/2017 08/29/16   Minna Antis, MD  traMADol (ULTRAM) 50 MG tablet Take 1 tablet (50 mg total) by mouth every 6 (six) hours as needed. Patient not taking: Reported on 05/08/2017 08/29/16   Minna Antis, MD     Review of Systems: Active FM LOF  / SROM: 1430 today bloody show - neg   Physical Exam:  VS: Height 5\' 6"  (1.676 m), weight 71.2 kg (157 lb), last menstrual period 02/07/2017.  General: alert and oriented, appears in mild distress over news of cesarean section Heart: RRR Lungs: Clear lung fields Abdomen: Gravid, soft and non-tender, non-distended / uterus: non  tender Extremities: no edema  FHT: 155, moderate variability, +accels, no decels TOCO:neg SVE:  deferred   Cephalic by leopolds  Prenatal Labs: Blood type/Rh --/--/PENDING (02/17 1607)  Antibody screen neg  Rubella Immune  Varicella Immune  RPR NR  HBsAg Neg  HIV NR  GC neg  Chlamydia neg  Genetic screening negative  1 hour GTT 74  3 hour GTT n/a  GBS positive   No results found.  Assessment: 34+[redacted] weeks gestation PPROM FHR category 1   Plan:  Admit for cesarean section  Labs pending Continuous fetal monitoring  The  risks of cesarean section discussed with the patient included but were not limited to: bleeding which may require transfusion or reoperation; infection which may require antibiotics; injury to bowel, bladder, ureters or other surrounding organs; injury to the fetus; need for additional procedures including hysterectomy in the event of a life-threatening hemorrhage; placental abnormalities wth subsequent pregnancies, incisional problems, thromboembolic phenomenon and other postoperative/anesthesia complications. The patient concurred with the proposed plan, giving informed written consent for the procedure.   Patient has been NPO since 1430 and she will remain NPO for procedure. Anesthesia and OR aware. Preoperative prophylactic antibiotics and SCDs ordered on call to the OR.  To OR when ready.     4. Post Partum Planning: - Infant feeding: bottle - Contraception: Nexplanon

## 2017-10-05 NOTE — Op Note (Signed)
  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  Procedure: Primary Low Transverse Cesarean Section through Pfannenstiel incision  Surgeon: Christeen DouglasBethany Valente Fosberg, MD  Assistant(s):  CTS  Anesthesia: Spinal anesthesia  Anesthesiologist: Yves Dillarroll, Paul, MD Anesthesiologist: Yves Dillarroll, Paul, MD CRNA: Ginger CarneMichelet, Stephanie, CRNA  Estimated Blood Loss:  400         Drains: n/a         Total IV Fluids: 1100ml  Urine Output: 150ml         Specimens: cord gas and cord blood         Complications:  None; patient tolerated the procedure well.         Disposition: PACU - hemodynamically stable.         Condition: stable  Findings:  A female infant in cephalic presentation. Amniotic fluid - Clear  Birth weight 4#7oz.  Apgars of 7 and 8 at one and five minutes respectively.  Intact placenta with a three-vessel cord.  Grossly normal uterus, tubes and ovaries bilaterally. No intraabdominal adhesions were noted.  Indications: PPROM with active HSV  Procedure Details  The patient was taken to Operating Room, identified as the correct patient and the procedure verified as C-Section Delivery. A formal Time Out was held with all team members present and in agreement.  After induction of anesthesia, the patient was draped and prepped in the usual sterile manner. A Pfannenstiel skin incision was made and carried down through the subcutaneous tissue to the fascia. Fascial incision was made and extended transversely with the Mayo scissors. The fascia was separated from the underlying rectus tissue superiorly and inferiorly. The peritoneum was identified and entered bluntly. Peritoneal incision was extended longitudinally. The utero-vesical peritoneal reflection was incised transversely and a bladder flap was created digitally.   A  low transverse hysterotomy was made. The fetus was delivered atraumatically. The umbilical cord was clamped x2 and cut and the infant was handed to the awaiting pediatricians. The placenta was removed intact and appeared normal, intact, and with a 3-vessel cord.   The uterus was exteriorized and cleared of all clot and debris. The hysterotomy was closed with running sutures of 0-Vicryl. A second imbricating layer was placed with the same suture. Excellent hemostasis was observed. The peritoneal cavity was cleared of all clots and debris. The uterus was returned to the abdomen.   The pelvis was irrigated and again, excellent hemostasis was noted. The fascia was then reapproximated with running sutures of 0 Vicryl.  The subcutaneous tissue was reapproximated with running sutures of 0 Vicry. The skin was reapproximated with Ensorb staples.  20ml (in 30 of 0.5% bupivicaine and 50ml of NSS) of liposomal bupivicaine placed in the fascial and skin lines.  Instrument, sponge, and needle counts were correct prior to the abdominal closure and at the conclusion of the case.   The patient tolerated the procedure well and was transferred to the recovery room in stable condition.   Christeen DouglasBethany Mercer Peifer, MD 10/05/2017

## 2017-10-06 ENCOUNTER — Encounter: Payer: Self-pay | Admitting: Obstetrics and Gynecology

## 2017-10-06 LAB — CBC
HCT: 33.7 % — ABNORMAL LOW (ref 35.0–47.0)
Hemoglobin: 12 g/dL (ref 12.0–16.0)
MCH: 32.8 pg (ref 26.0–34.0)
MCHC: 35.8 g/dL (ref 32.0–36.0)
MCV: 91.6 fL (ref 80.0–100.0)
PLATELETS: 183 10*3/uL (ref 150–440)
RBC: 3.68 MIL/uL — ABNORMAL LOW (ref 3.80–5.20)
RDW: 12.4 % (ref 11.5–14.5)
WBC: 12.8 10*3/uL — AB (ref 3.6–11.0)

## 2017-10-06 NOTE — Progress Notes (Signed)
Subjective: Postpartum Day 1: Cesarean Delivery Patient reports  pain well controlledObjective: Vital signs in last 24 hours: Temp:  [97.3 F (36.3 C)-99.3 F (37.4 C)] 98.7 F (37.1 C) (02/18 0803) Pulse Rate:  [63-104] 83 (02/18 1245) Resp:  [11-24] 18 (02/18 0803) BP: (105-140)/(58-98) 105/62 (02/18 1245) SpO2:  [96 %-100 %] 98 % (02/18 1245) Weight:  [71.2 kg (157 lb)] 71.2 kg (157 lb) (02/17 1536)  Physical Exam:  General: alert and cooperative Lochia: appropriate Uterine Fundus: firm Incision: no significant drainage DVT Evaluation: No evidence of DVT seen on physical exam.  Recent Labs    10/05/17 1607 10/06/17 0333  HGB 13.1 12.0  HCT 38.0 33.7*    Assessment/Plan: Status post Cesarean section. Doing well postoperatively.  Continue current care. Po meds Ihor Austinhomas J Kenston Longton 10/06/2017, 12:52 PM

## 2017-10-06 NOTE — Anesthesia Postprocedure Evaluation (Signed)
Anesthesia Post Note  Patient: Brandy Ward  Procedure(s) Performed: CESAREAN SECTION (N/A Abdomen)  Patient location during evaluation: Mother Baby Anesthesia Type: Spinal Level of consciousness: awake, awake and alert and oriented Pain management: pain level controlled Vital Signs Assessment: vitals unstable and post-procedure vital signs reviewed and stable Respiratory status: spontaneous breathing Cardiovascular status: blood pressure returned to baseline Postop Assessment: no headache Anesthetic complications: no     Last Vitals:  Vitals:   10/05/17 2331 10/06/17 0443  BP: 122/68 114/74  Pulse: 89 78  Resp: 18 18  Temp: 36.9 C 36.9 C  SpO2: 98% 98%    Last Pain:  Vitals:   10/06/17 0443  TempSrc: Oral  PainSc:                  Vernie MurdersPope,  Azel Gumina G

## 2017-10-07 LAB — URINE DRUG SCREEN, QUALITATIVE (ARMC ONLY)
Amphetamines, Ur Screen: NOT DETECTED
BARBITURATES, UR SCREEN: NOT DETECTED
BENZODIAZEPINE, UR SCRN: NOT DETECTED
CANNABINOID 50 NG, UR ~~LOC~~: NOT DETECTED
Cocaine Metabolite,Ur ~~LOC~~: NOT DETECTED
MDMA (Ecstasy)Ur Screen: NOT DETECTED
METHADONE SCREEN, URINE: NOT DETECTED
OPIATE, UR SCREEN: NOT DETECTED
PHENCYCLIDINE (PCP) UR S: NOT DETECTED
Tricyclic, Ur Screen: NOT DETECTED

## 2017-10-07 LAB — RPR: RPR Ser Ql: NONREACTIVE

## 2017-10-07 MED ORDER — VALACYCLOVIR HCL 500 MG PO TABS
500.0000 mg | ORAL_TABLET | Freq: Two times a day (BID) | ORAL | Status: DC
  Administered 2017-10-08 (×2): 500 mg via ORAL
  Filled 2017-10-07 (×4): qty 1

## 2017-10-07 MED ORDER — ONDANSETRON 4 MG PO TBDP
4.0000 mg | ORAL_TABLET | Freq: Four times a day (QID) | ORAL | Status: DC | PRN
  Administered 2017-10-07: 4 mg via ORAL
  Filled 2017-10-07: qty 1

## 2017-10-07 NOTE — Progress Notes (Signed)
Post-Op Day 2  Subjective: Doing well, no concerns. Ambulating without difficulty, pain managed with PO meds, tolerating regular diet, and voiding without difficulty. Some nausea.  No fever/chills, chest pain, shortness of breath, vomiting, or leg pain. No nipple or breast pain.   Objective: BP 104/68 (BP Location: Right Arm)   Pulse 80   Temp 98 F (36.7 C) (Oral)   Resp 18   Ht 5\' 6"  (1.676 m)   Wt 71.2 kg (157 lb)   LMP 02/07/2017   SpO2 98%   BMI 25.34 kg/m    Physical Exam:  General: alert, cooperative, appears stated age and no distress Breasts: soft/nontender CV: RRR Pulm: nl effort, CTABL Abdomen: soft, non-tender, active bowel sounds Uterine Fundus: firm Incision: healing well, no significant drainage, no dehiscence, no significant erythema Lochia: appropriate DVT Evaluation: No evidence of DVT seen on physical exam. No cords or calf tenderness. No significant calf/ankle edema.  Recent Labs    10/05/17 1607 10/06/17 0333  HGB 13.1 12.0  HCT 38.0 33.7*  WBC 14.0* 12.8*  PLT 255 183    Assessment/Plan: 21 y.o. G1P0000 postop day # 2  -Continue routine PP care -Baby in ICN -Encouraged snug fitting bra, cold application, Tylenol PRN, and cabbage leaves for bottlefeeding.  -Plans Nexplanon for contraception -Valtrex 500mg  BID x3 days for HSV outbreak.  Disposition: Continue inpatient postpartum care.   LOS: 2 days   Genia DelMargaret Malaysia Crance, CNM 10/07/2017, 9:18 AM   ----- Genia DelMargaret Taylie Helder Certified Nurse Midwife [facility]

## 2017-10-07 NOTE — Plan of Care (Signed)
Vs stable; up ad lib; encouraged to ambulate to SCN to see baby; tolerating regular diet; taking motrin and percocet for pain control

## 2017-10-08 LAB — SURGICAL PATHOLOGY

## 2017-10-08 MED ORDER — FERROUS SULFATE 325 (65 FE) MG PO TABS: 325.0000 mg | ORAL_TABLET | Freq: Every day | ORAL | 1 refills | Status: AC

## 2017-10-08 MED ORDER — OXYCODONE-ACETAMINOPHEN 5-325 MG PO TABS: 1.0000 | ORAL_TABLET | Freq: Four times a day (QID) | ORAL | 0 refills | Status: DC | PRN

## 2017-10-08 MED ORDER — IBUPROFEN 800 MG PO TABS: 800.0000 mg | ORAL_TABLET | Freq: Three times a day (TID) | ORAL | 1 refills | Status: DC | PRN

## 2017-10-08 MED ORDER — DOCUSATE SODIUM 100 MG PO CAPS: 100.0000 mg | ORAL_CAPSULE | Freq: Two times a day (BID) | ORAL | 0 refills | Status: AC

## 2017-10-08 NOTE — Progress Notes (Signed)
Period of purple cry video watched by mother. Mother verbalized understanding and had no questions. Mother given a copy of video to take home with her. 

## 2017-10-08 NOTE — Progress Notes (Signed)
Patient discharged home with family. Discharge instructions, prescriptions, hygiene kit and follow up appointment given to and reviewed with patient and family. Patient verbalized understanding. Escorted out via wheelchair by auxiliary.

## 2017-10-13 ENCOUNTER — Ambulatory Visit: Payer: Self-pay

## 2017-10-13 NOTE — Lactation Note (Signed)
This note was copied from a baby's chart. Lactation Consultation Note  Patient Name: Brandy Ward ZOXWR'UToday's Date: 10/13/2017  Mom originally did not want to pump or breast feed.  Today mom noticed that she is leaking breast milk and is rethinking that she might want to try and pump.  Until we can ascertain mom's commitment level, a manual pump was given with instructions in use for mom to take home with her.  Discussed supply and demand and need to pump frequently to ensure a plentiful supply for baby and to prevent engorgement after pumping initiated.  Discussed with mom that if she continued with her commitment to provide breast milk for her baby, we would discuss getting her a DEBP.  Told mom to call us when she comes in to visit so we can discuss further how pumping is progressing.   Maternal Data    Feeding Feeding Type: Formula Length of feed: 30 min  LATCH Score                   Interventions    Lactation Tools Discussed/Used Tools: 47F feeding tube / Syringe;Pump   Consult Status      Louis MeckelWilliams, Nyair Depaulo Kay 10/13/2017, 8:52 PM

## 2018-02-11 IMAGING — DX DG FOOT COMPLETE 3+V*R*
3 series · 3 of 3 positions shown · non-contrast
Comparison: None.

CLINICAL DATA: Painful on metal box with bruising and pain, initial
encounter

EXAM:
RIGHT FOOT COMPLETE - 3+ VIEW

[foot ap]
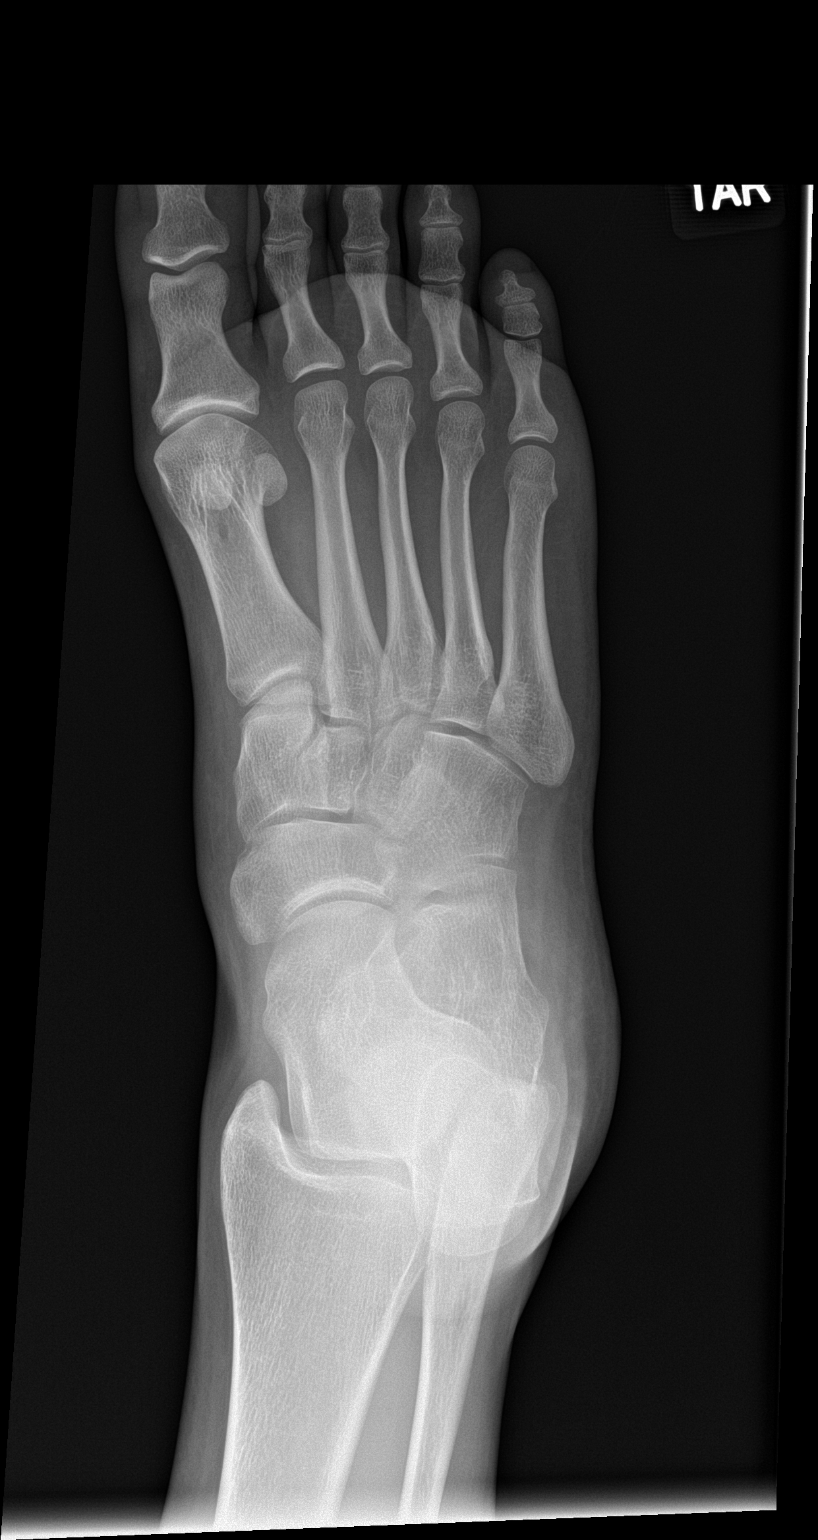

[foot obl]
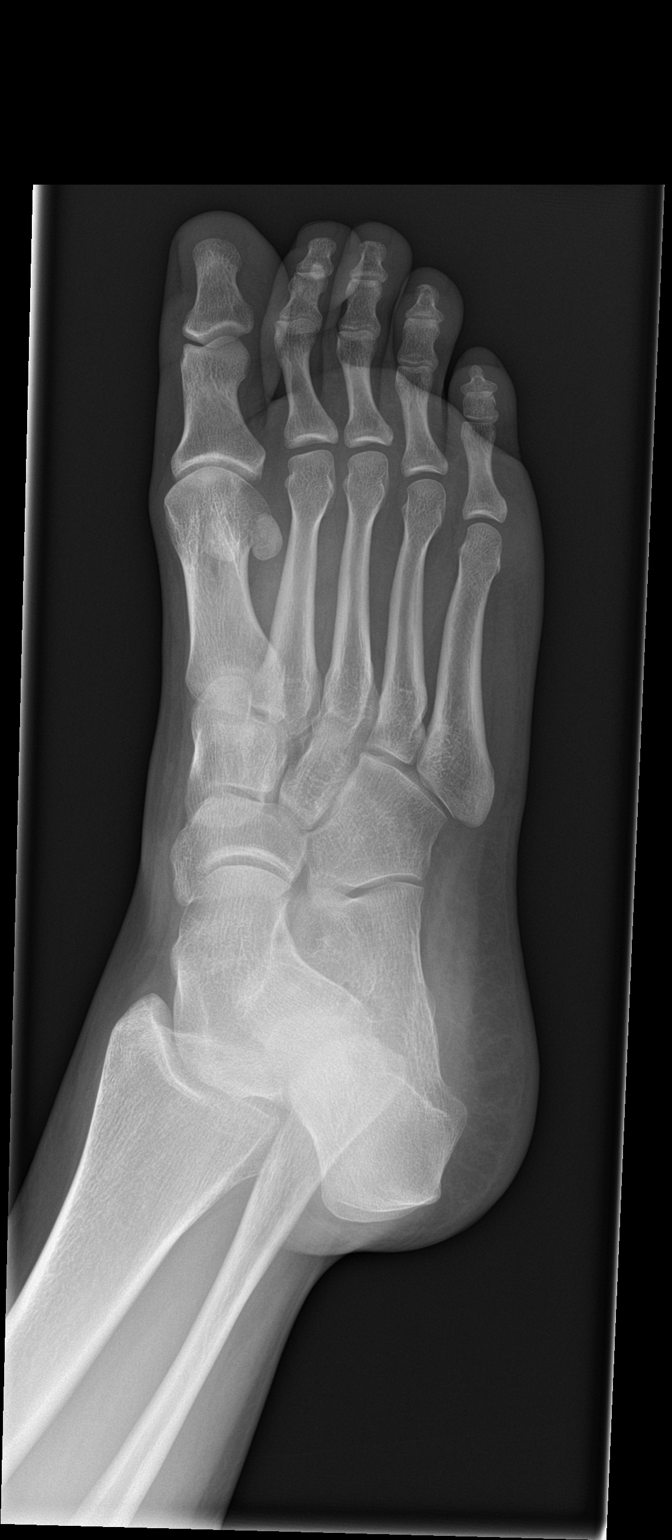

[foot lat]
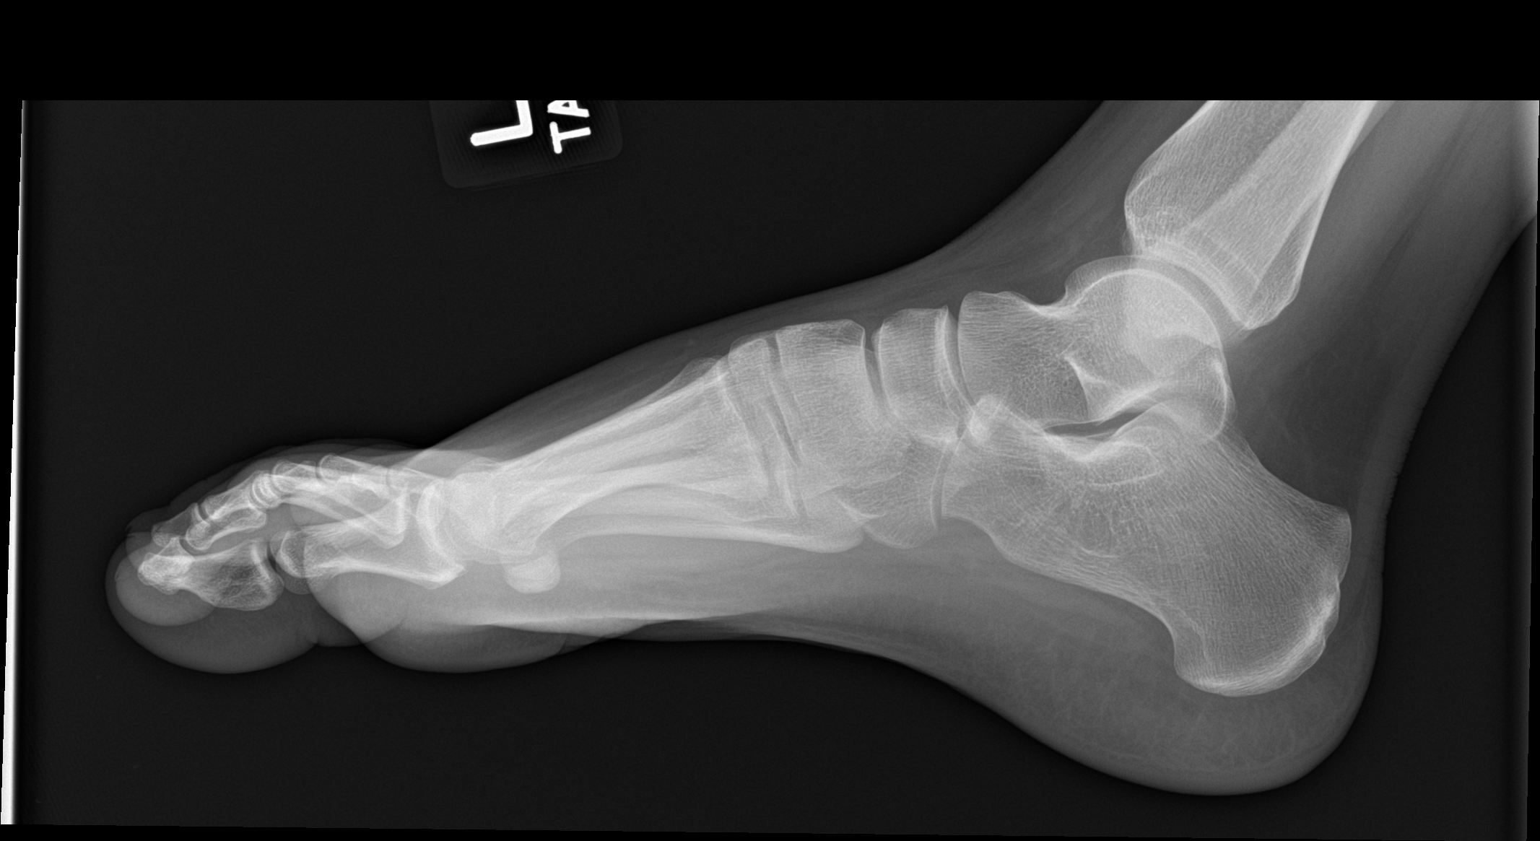

[3 of 3 positions shown; findings below may reference images not displayed]

FINDINGS: There is no evidence of fracture or dislocation. There is no
evidence of arthropathy or other focal bone abnormality. Soft
tissues are unremarkable.
IMPRESSION: No acute abnormality noted.

## 2019-01-22 ENCOUNTER — Other Ambulatory Visit: Payer: Self-pay

## 2019-01-22 ENCOUNTER — Emergency Department
Admission: EM | Admit: 2019-01-22 | Discharge: 2019-01-22 | Disposition: A | Discharge status: Home / Self Care | Payer: Medicaid Other | Attending: Emergency Medicine | Admitting: Emergency Medicine

## 2019-01-22 ENCOUNTER — Encounter: Payer: Self-pay | Admitting: *Deleted

## 2019-01-22 DIAGNOSIS — J45909 Unspecified asthma, uncomplicated: Secondary | ICD-10-CM | POA: Diagnosis not present

## 2019-01-22 DIAGNOSIS — Z79899 Other long term (current) drug therapy: Secondary | ICD-10-CM | POA: Insufficient documentation

## 2019-01-22 DIAGNOSIS — L02414 Cutaneous abscess of left upper limb: Secondary | ICD-10-CM | POA: Diagnosis not present

## 2019-01-22 DIAGNOSIS — R2231 Localized swelling, mass and lump, right upper limb: Secondary | ICD-10-CM | POA: Diagnosis present

## 2019-01-22 DIAGNOSIS — Z91040 Latex allergy status: Secondary | ICD-10-CM | POA: Insufficient documentation

## 2019-01-22 DIAGNOSIS — L0291 Cutaneous abscess, unspecified: Secondary | ICD-10-CM

## 2019-01-22 MED ORDER — MUPIROCIN 2 % EX OINT: TOPICAL_OINTMENT | CUTANEOUS | 0 refills | Status: AC

## 2019-01-22 MED ORDER — CEPHALEXIN 500 MG PO CAPS: 500.0000 mg | ORAL_CAPSULE | Freq: Three times a day (TID) | ORAL | 0 refills | Status: AC

## 2019-01-22 MED ORDER — LIDOCAINE HCL 1 % IJ SOLN
5.0000 mL | Freq: Once | INTRAMUSCULAR | Status: AC
Start: 2019-01-22 — End: 2019-01-22
  Administered 2019-01-22: 5 mL
  Filled 2019-01-22: qty 10

## 2019-01-22 MED ORDER — DOXYCYCLINE HYCLATE 100 MG PO CAPS: 100.0000 mg | ORAL_CAPSULE | Freq: Two times a day (BID) | ORAL | 0 refills | Status: AC

## 2019-01-22 NOTE — ED Triage Notes (Signed)
Pt to ED with recurring abscesses on her left arm. PT had a tattoo done 6 months ago and they have been recurring ever since. Pt was placed on antibiotics in the past that she states did not help and just made her nauseous. Redness noted around the upper area of the tattoo.

## 2019-01-22 NOTE — ED Notes (Signed)
See triage note  Presents with possible areas to left upper arm  States noticed 1 area 2-3 days ago  And then another this am

## 2019-01-22 NOTE — ED Provider Notes (Signed)
[facility] Emergency Department Provider Note  ____________________________________________  Time seen: Approximately 6:57 PM  I have reviewed the triage vital signs and the nursing notes.   HISTORY  Chief Complaint Abscess    HPI Brandy Ward is a 23 y.o. female presents to the emergency department with erythema of the left upper arm along a recently obtained a tattoo with a small pustule.  Patient states that pain and redness has worsened over the past 2 to 3 days.  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  She denies a history of diabetes or IV drug use.  She denies fever or chills. No other alleviating measures have been attempted.         Past Medical History:  Diagnosis Date  . Asthma   . Frequent headaches    migraine  . GERD (gastroesophageal reflux disease)   . Herpes   . PONV (postoperative nausea and vomiting)     Patient Active Problem List   Diagnosis Date Noted  . Pregnancy 10/05/2017  . Preterm premature rupture of membranes in third trimester 10/05/2017  . Labor and delivery indication for care or intervention 08/26/2017  . Vaginal bleeding in pregnancy, second trimester 06/28/2017  . Labor and delivery, indication for care 06/28/2017  . First trimester screening     Past Surgical History:  Procedure Laterality Date  . CESAREAN SECTION N/A 10/05/2017   Procedure: CESAREAN SECTION;  Surgeon: Christeen Douglas, MD;  Location: [facility] ORS;  Service: Obstetrics;  Laterality: N/A;  . EYE SURGERY    . WISDOM TOOTH EXTRACTION      Prior to Admission medications   Medication Sig Start Date End Date Taking? Authorizing Provider  albuterol (PROVENTIL HFA;VENTOLIN HFA) 108 (90 Base) MCG/ACT inhaler Inhale 2 puffs into the lungs every 6 (six) hours as needed for wheezing or shortness of breath.    [provider]  cephALEXin (KEFLEX) 500 MG capsule Take 1 capsule (500 mg total) by mouth 3 (three) times daily  for 7 days. 01/22/19 01/29/19  Orvil Feil, PA-C  cetirizine (ZYRTEC) 10 MG tablet Take 10 mg by mouth daily.    [provider]  doxycycline (VIBRAMYCIN) 100 MG capsule Take 1 capsule (100 mg total) by mouth 2 (two) times daily for 7 days. 01/22/19 01/29/19  Orvil Feil, PA-C  ferrous sulfate 325 (65 FE) MG tablet Take 1 tablet (325 mg total) by mouth daily with breakfast. Take with Vitamin C 10/08/17 12/07/17  Christeen Douglas, MD  ibuprofen (ADVIL,MOTRIN) 800 MG tablet Take 1 tablet (800 mg total) by mouth every 8 (eight) hours as needed for moderate pain or cramping. 10/08/17   Christeen Douglas, MD  omeprazole (PRILOSEC) 40 MG capsule Take 40 mg by mouth daily.    [provider]    Allergies Latex  Family History  Problem Relation Age of Onset  . Hypertension Mother   . Diabetes Maternal Grandmother     Social History Social History   Tobacco Use  . Smoking status: Never Smoker  . Smokeless tobacco: Never Used  Substance Use Topics  . Alcohol use: No  . Drug use: No     Review of Systems  Constitutional: No fever/chills Eyes: No visual changes. No discharge ENT: No upper respiratory complaints. Cardiovascular: no chest pain. Respiratory: no cough. No SOB. Gastrointestinal: No abdominal pain.  No nausea, no vomiting.  No diarrhea.  No constipation. Musculoskeletal: Negative for musculoskeletal pain. Skin: Patient has erythema and pain  along the left upper arm. Neurological: Negative for headaches, focal weakness or numbness.   ____________________________________________   PHYSICAL EXAM:  VITAL SIGNS: ED Triage Vitals  Enc Vitals Group     BP 01/22/19 1820 138/78     Pulse Rate 01/22/19 1820 95     Resp 01/22/19 1820 16     Temp 01/22/19 1820 98.7 F (37.1 C)     Temp Source 01/22/19 1820 Oral     SpO2 01/22/19 1820 99 %     Weight 01/22/19 1819 165 lb (74.8 kg)     Height 01/22/19 1819 5\' 6"  (1.676 m)     Head Circumference --      Peak  Flow --      Pain Score 01/22/19 1819 10     Pain Loc --      Pain Edu? --      Excl. in GC? --      Constitutional: Alert and oriented. Well appearing and in no acute distress. Eyes: Conjunctivae are normal. PERRL. EOMI. Head: Atraumatic.  Cardiovascular: Normal rate, regular rhythm. Normal S1 and S2.  Good peripheral circulation. Respiratory: Normal respiratory effort without tachypnea or retractions. Lungs CTAB. Good air entry to the bases with no decreased or absent breath sounds. Musculoskeletal: Full range of motion to all extremities. No gross deformities appreciated.  Skin: Patient has approximately 2-1/2 cm of circumferential cellulitis along left upper arm and distended he recently obtained tattoo.  Patient has associated induration and a small pustule. Psychiatric: Mood and affect are normal. Speech and behavior are normal. Patient exhibits appropriate insight and judgement.   ____________________________________________   LABS (all labs ordered are listed, but only abnormal results are displayed)  Labs Reviewed - No data to display ____________________________________________  EKG   ____________________________________________  RADIOLOGY   No results found.  ____________________________________________    PROCEDURES  Procedure(s) performed:    Procedures  INCISION AND DRAINAGE Performed by: Orvil FeilJaclyn M Freman Lapage Consent: Verbal consent obtained. Risks and benefits: risks, benefits and alternatives were discussed Type: abscess  Body area: Left upper arm.   Anesthesia: local infiltration  Incision was made with a scalpel.  Local anesthetic: lidocaine 1% without  epinephrine  Anesthetic total: 1 ml  Complexity: complex Blunt dissection to break up loculations  Drainage: purulent  Drainage amount: Small  Patient tolerance: Patient tolerated the procedure well with no immediate complications.     Medications  lidocaine (XYLOCAINE) 1 % (with  pres) injection 5 mL (5 mLs Infiltration Given by Other 01/22/19 1845)     ____________________________________________   INITIAL IMPRESSION / ASSESSMENT AND PLAN / ED COURSE  Pertinent labs & imaging results that were available during my care of the patient were reviewed by me and considered in my medical decision making (see chart for details).  Review of the [place] CSRS was performed in accordance of the [facility] prior to dispensing any controlled drugs.         Assessment and plan Abscess Patient presents to the emergency department with pain and erythema of the left upper arm for the past 2 to 3 days after obtaining a tattoo.  On physical exam, vital signs are reassuring without tachycardia or fever.  Patient has 2 cm x 2 cm region of circumferential cellulitis with associated induration and a small pustule.  There was concern for early abscess formation.  Patient underwent an incision and drainage procedure in the emergency department.  Patient states that she does not typically tolerate Bactrim well as it  causes her to have severe nausea.  Patient was discharged with Keflex and doxycycline after she assured me that there was absolutely no possibility of pregnancy.  Patient was also advised to use topical mupirocin ointment over affected area.  She voiced understanding.  I cautioned patient that should her symptoms worsen instead of improve, she should return to the emergency department.    ____________________________________________  FINAL CLINICAL IMPRESSION(S) / ED DIAGNOSES  Final diagnoses:  Abscess      NEW MEDICATIONS STARTED DURING THIS VISIT:  ED Discharge Orders         Ordered    doxycycline (VIBRAMYCIN) 100 MG capsule  2 times daily     01/22/19 1853    cephALEXin (KEFLEX) 500 MG capsule  3 times daily     01/22/19 1853              This chart was dictated using voice recognition software/Dragon. Despite best efforts to proofread, errors can occur which  can change the meaning. Any change was purely unintentional.    Orvil Feil, PA-C 01/22/19 Cyndie Chime, MD 01/22/19 2045

## 2019-01-22 NOTE — Discharge Instructions (Addendum)
Take Keflex 3 times daily for the next 7 days. Take doxycycline twice daily for the next 7 days.  You should not take doxycycline if there is any possibility of pregnancy whatsoever. Return to the emergency department if symptoms worsen after 3 days.

## 2019-03-18 DIAGNOSIS — J45909 Unspecified asthma, uncomplicated: Secondary | ICD-10-CM | POA: Insufficient documentation

## 2019-03-18 DIAGNOSIS — F819 Developmental disorder of scholastic skills, unspecified: Secondary | ICD-10-CM

## 2019-03-19 ENCOUNTER — Ambulatory Visit (LOCAL_COMMUNITY_HEALTH_CENTER): Payer: Self-pay

## 2019-03-19 ENCOUNTER — Other Ambulatory Visit: Payer: Self-pay

## 2019-03-19 DIAGNOSIS — Z111 Encounter for screening for respiratory tuberculosis: Secondary | ICD-10-CM

## 2019-03-22 ENCOUNTER — Other Ambulatory Visit: Payer: Self-pay

## 2019-03-22 ENCOUNTER — Ambulatory Visit (LOCAL_COMMUNITY_HEALTH_CENTER): Payer: Medicaid Other

## 2019-03-22 DIAGNOSIS — Z111 Encounter for screening for respiratory tuberculosis: Secondary | ICD-10-CM

## 2019-03-22 LAB — TB SKIN TEST
Induration: 0 mm
TB Skin Test: NEGATIVE

## 2019-03-22 NOTE — Progress Notes (Signed)
Addendum opened in error.

## 2019-04-04 ENCOUNTER — Other Ambulatory Visit: Payer: Self-pay

## 2019-04-04 ENCOUNTER — Encounter: Payer: Self-pay | Admitting: Intensive Care

## 2019-04-04 ENCOUNTER — Emergency Department
Admission: EM | Admit: 2019-04-04 | Discharge: 2019-04-04 | Discharge status: Against Medical Advice | Payer: Medicaid Other | Attending: Emergency Medicine | Admitting: Emergency Medicine

## 2019-04-04 DIAGNOSIS — Z5321 Procedure and treatment not carried out due to patient leaving prior to being seen by health care provider: Secondary | ICD-10-CM | POA: Insufficient documentation

## 2019-04-04 DIAGNOSIS — R1032 Left lower quadrant pain: Secondary | ICD-10-CM | POA: Diagnosis present

## 2019-04-04 LAB — URINALYSIS, COMPLETE (UACMP) WITH MICROSCOPIC
Bacteria, UA: NONE SEEN
Bilirubin Urine: NEGATIVE
Glucose, UA: NEGATIVE mg/dL
Ketones, ur: NEGATIVE mg/dL
Nitrite: NEGATIVE
Protein, ur: NEGATIVE mg/dL
Specific Gravity, Urine: 1.012 (ref 1.005–1.030)
pH: 7 (ref 5.0–8.0)

## 2019-04-04 LAB — COMPREHENSIVE METABOLIC PANEL
ALT: 14 U/L (ref 0–44)
AST: 16 U/L (ref 15–41)
Albumin: 3.9 g/dL (ref 3.5–5.0)
Alkaline Phosphatase: 96 U/L (ref 38–126)
Anion gap: 11 (ref 5–15)
BUN: 8 mg/dL (ref 6–20)
CO2: 27 mmol/L (ref 22–32)
Calcium: 8.8 mg/dL — ABNORMAL LOW (ref 8.9–10.3)
Chloride: 99 mmol/L (ref 98–111)
Creatinine, Ser: 0.46 mg/dL (ref 0.44–1.00)
GFR calc Af Amer: >60 mL/min (ref >60)
GFR calc non Af Amer: >60 mL/min (ref >60)
Glucose, Bld: 100 mg/dL — ABNORMAL HIGH (ref 70–99)
Potassium: 3.6 mmol/L (ref 3.5–5.1)
Sodium: 137 mmol/L (ref 135–145)
Total Bilirubin: 0.2 mg/dL — ABNORMAL LOW (ref 0.3–1.2)
Total Protein: 7 g/dL (ref 6.5–8.1)

## 2019-04-04 LAB — CBC
HCT: 38.8 % (ref 36.0–46.0)
Hemoglobin: 13.2 g/dL (ref 12.0–15.0)
MCH: 31.4 pg (ref 26.0–34.0)
MCHC: 34 g/dL (ref 30.0–36.0)
MCV: 92.4 fL (ref 80.0–100.0)
Platelets: 257 10*3/uL (ref 150–400)
RBC: 4.2 MIL/uL (ref 3.87–5.11)
RDW: 12.3 % (ref 11.5–15.5)
WBC: 10 10*3/uL (ref 4.0–10.5)
nRBC: 0 % (ref 0.0–0.2)

## 2019-04-04 LAB — LIPASE, BLOOD: Lipase: 28 U/L (ref 11–51)

## 2019-04-04 NOTE — ED Triage Notes (Signed)
Patient c/o left lower abdominal pain with burning during urination. Ambulatory with no problems

## 2019-04-04 NOTE — ED Notes (Signed)
Patient came to desk and told registration that she was leaving.

## 2019-04-05 ENCOUNTER — Ambulatory Visit: Payer: Medicaid Other

## 2019-12-15 ENCOUNTER — Emergency Department: Payer: Medicaid Other

## 2019-12-15 ENCOUNTER — Encounter: Payer: Self-pay | Admitting: *Deleted

## 2019-12-15 ENCOUNTER — Emergency Department
Admission: EM | Admit: 2019-12-15 | Discharge: 2019-12-16 | Disposition: A | Discharge status: Home / Self Care | Payer: Medicaid Other | Attending: Student in an Organized Health Care Education/Training Program | Admitting: Student in an Organized Health Care Education/Training Program

## 2019-12-15 ENCOUNTER — Other Ambulatory Visit: Payer: Self-pay

## 2019-12-15 DIAGNOSIS — R1030 Lower abdominal pain, unspecified: Secondary | ICD-10-CM | POA: Insufficient documentation

## 2019-12-15 DIAGNOSIS — J45909 Unspecified asthma, uncomplicated: Secondary | ICD-10-CM | POA: Insufficient documentation

## 2019-12-15 DIAGNOSIS — Z79899 Other long term (current) drug therapy: Secondary | ICD-10-CM | POA: Insufficient documentation

## 2019-12-15 DIAGNOSIS — N898 Other specified noninflammatory disorders of vagina: Secondary | ICD-10-CM | POA: Diagnosis not present

## 2019-12-15 DIAGNOSIS — R300 Dysuria: Secondary | ICD-10-CM

## 2019-12-15 LAB — CBC
HCT: 38.4 % (ref 36.0–46.0)
Hemoglobin: 13.2 g/dL (ref 12.0–15.0)
MCH: 31.4 pg (ref 26.0–34.0)
MCHC: 34.4 g/dL (ref 30.0–36.0)
MCV: 91.4 fL (ref 80.0–100.0)
Platelets: 287 10*3/uL (ref 150–400)
RBC: 4.2 MIL/uL (ref 3.87–5.11)
RDW: 12.1 % (ref 11.5–15.5)
WBC: 9.9 10*3/uL (ref 4.0–10.5)
nRBC: 0 % (ref 0.0–0.2)

## 2019-12-15 LAB — URINALYSIS, COMPLETE (UACMP) WITH MICROSCOPIC
Bacteria, UA: NONE SEEN
Bilirubin Urine: NEGATIVE
Glucose, UA: NEGATIVE mg/dL
Ketones, ur: NEGATIVE mg/dL
Nitrite: NEGATIVE
Protein, ur: NEGATIVE mg/dL
Specific Gravity, Urine: 1.005 (ref 1.005–1.030)
pH: 6 (ref 5.0–8.0)

## 2019-12-15 LAB — COMPREHENSIVE METABOLIC PANEL
ALT: 21 U/L (ref 0–44)
AST: 17 U/L (ref 15–41)
Albumin: 4.2 g/dL (ref 3.5–5.0)
Alkaline Phosphatase: 90 U/L (ref 38–126)
Anion gap: 8 (ref 5–15)
BUN: 10 mg/dL (ref 6–20)
CO2: 26 mmol/L (ref 22–32)
Calcium: 8.9 mg/dL (ref 8.9–10.3)
Chloride: 103 mmol/L (ref 98–111)
Creatinine, Ser: 0.54 mg/dL (ref 0.44–1.00)
GFR calc Af Amer: >60 mL/min (ref >60)
GFR calc non Af Amer: >60 mL/min (ref >60)
Glucose, Bld: 94 mg/dL (ref 70–99)
Potassium: 3.7 mmol/L (ref 3.5–5.1)
Sodium: 137 mmol/L (ref 135–145)
Total Bilirubin: 0.7 mg/dL (ref 0.3–1.2)
Total Protein: 7.3 g/dL (ref 6.5–8.1)

## 2019-12-15 LAB — WET PREP, GENITAL
Clue Cells Wet Prep HPF POC: NONE SEEN
Sperm: NONE SEEN
Trich, Wet Prep: NONE SEEN
Yeast Wet Prep HPF POC: NONE SEEN

## 2019-12-15 LAB — CHLAMYDIA/NGC RT PCR (ARMC ONLY)??????????: Chlamydia Tr: NOT DETECTED

## 2019-12-15 LAB — CHLAMYDIA/NGC RT PCR (ARMC ONLY): N gonorrhoeae: NOT DETECTED

## 2019-12-15 LAB — PREGNANCY, URINE: Preg Test, Ur: NEGATIVE

## 2019-12-15 MED ORDER — METRONIDAZOLE 500 MG PO TABS
500.0000 mg | ORAL_TABLET | Freq: Once | ORAL | Status: AC
  Administered 2019-12-16: 500 mg via ORAL
  Filled 2019-12-15: qty 1

## 2019-12-15 MED ORDER — METRONIDAZOLE 500 MG PO TABS: 500.0000 mg | ORAL_TABLET | Freq: Two times a day (BID) | ORAL | 0 refills | Status: AC

## 2019-12-15 NOTE — ED Notes (Signed)
See triage note- pt reports discharges, dysuria an hx of frequent UTIs. Pt also reports dizziness, says she has passed out before when she's has UTIs. Also reports back pain.

## 2019-12-15 NOTE — ED Triage Notes (Signed)
Pt has a headache.  Taking allergy meds with some relief.  Pt also reports dysuria and a foul odor discharge.  Pt alert  Speech clear.

## 2019-12-15 NOTE — ED Notes (Signed)
Pt unable to void at this time. 

## 2019-12-15 NOTE — ED Provider Notes (Signed)
[facility] Emergency Department Provider Note  ____________________________________________  Time seen: Approximately 10:32 PM  I have reviewed the triage vital signs and the nursing notes.   HISTORY  Chief Complaint Dysuria and Headache    HPI Brandy Ward is a 24 y.o. female that presents to the emergency department for evaluation of dizziness, bilateral low back pain, bilateral low abdominal discomfort, dysuria, vaginal discharge for several days.  Patient states that last year when she had a urinary tract infection, it caused her to get very dizzy and pass out.  Patient has frequent bacterial vaginosis and this feels like her bacterial vaginosis.  Vaginal discharge has a strong odor.  She regularly gets this same low abdominal discomfort and low back pain with bacterial vaginosis.  Patient would also like to be tested for STDs as well.  She is sexually active with one partner.  Last menstrual period was 2 weeks ago.  She does have some vaginal spotting.  She has never had a kidney stone. Normal bowel movements.  No fever, shortness of breath, chest pain, vomiting, hematuria.  Past Medical History:  Diagnosis Date  . Asthma   . Frequent headaches    migraine  . GERD (gastroesophageal reflux disease)   . Herpes   . PONV (postoperative nausea and vomiting)     Patient Active Problem List   Diagnosis Date Noted  . Asthma without status asthmaticus 03/18/2019  . Learning disability 04/14/2017    Past Surgical History:  Procedure Laterality Date  . CESAREAN SECTION N/A 10/05/2017   Procedure: CESAREAN SECTION;  Surgeon: Christeen Douglas, MD;  Location: [facility] ORS;  Service: Obstetrics;  Laterality: N/A;  . EYE SURGERY    . WISDOM TOOTH EXTRACTION      Prior to Admission medications   Medication Sig Start Date End Date Taking? Authorizing Provider  albuterol (PROVENTIL HFA;VENTOLIN HFA) 108 (90 Base) MCG/ACT inhaler Inhale 2 puffs into the lungs every 6  (six) hours as needed for wheezing or shortness of breath.    [provider]  cetirizine (ZYRTEC) 10 MG tablet Take 10 mg by mouth daily.    [provider]  ferrous sulfate 325 (65 FE) MG tablet Take 1 tablet (325 mg total) by mouth daily with breakfast. Take with Vitamin C 10/08/17 12/07/17  Christeen Douglas, MD  ibuprofen (ADVIL,MOTRIN) 800 MG tablet Take 1 tablet (800 mg total) by mouth every 8 (eight) hours as needed for moderate pain or cramping. 10/08/17   Christeen Douglas, MD  metroNIDAZOLE (FLAGYL) 500 MG tablet Take 1 tablet (500 mg total) by mouth 2 (two) times daily for 7 days. 12/15/19 12/22/19  Enid Derry, PA-C  mupirocin ointment Idelle Jo) 2 % Apply to affected area 3 times daily 01/22/19 01/22/20  Orvil Feil, PA-C  omeprazole (PRILOSEC) 40 MG capsule Take 40 mg by mouth daily.    [provider]    Allergies Latex  Family History  Problem Relation Age of Onset  . Hypertension Mother   . Asthma Mother   . Anxiety disorder Mother   . Depression Mother   . Fibroids Mother   . Diabetes Maternal Grandmother   . Asthma Maternal Grandmother   . Cervical cancer Maternal Grandmother   . Hypertension Maternal Grandmother   . Asthma Sister   . Diabetes Maternal Aunt   . Brain cancer Paternal Grandfather   . Lung cancer Paternal Grandfather     Social History Social History   Tobacco Use  . Smoking status:  Never Smoker  . Smokeless tobacco: Never Used  Substance Use Topics  . Alcohol use: No  . Drug use: No     Review of Systems  Constitutional: No fever/chills Cardiovascular: No chest pain. Respiratory: No cough. No SOB. Gastrointestinal: Positive for low abdominal discomfort.  No nausea, no vomiting.  Genitourinary: Positive for dysuria.  Positive for vaginal discharge. Musculoskeletal: Positive for low back pain. Skin: Negative for rash, abrasions, lacerations, ecchymosis. Neurological: Negative for  headache   ____________________________________________   PHYSICAL EXAM:  VITAL SIGNS: ED Triage Vitals  Enc Vitals Group     BP 12/15/19 1943 119/68     Pulse Rate 12/15/19 1943 83     Resp 12/15/19 1943 18     Temp 12/15/19 1943 98.8 F (37.1 C)     Temp Source 12/15/19 1943 Oral     SpO2 12/15/19 1943 99 %     Weight 12/15/19 1944 170 lb (77.1 kg)     Height 12/15/19 1944 5\' 7"  (1.702 m)     Head Circumference --      Peak Flow --      Pain Score 12/15/19 1944 7     Pain Loc --      Pain Edu? --      Excl. in GC? --      Constitutional: Alert and oriented. Well appearing and in no acute distress. Eyes: Conjunctivae are normal. PERRL. EOMI. Head: Atraumatic. ENT:      Ears:      Nose: No congestion/rhinnorhea.      Mouth/Throat: Mucous membranes are moist.  Neck: No stridor. Cardiovascular: Normal rate, regular rhythm.  Good peripheral circulation. Respiratory: Normal respiratory effort without tachypnea or retractions. Lungs CTAB. Good air entry to the bases with no decreased or absent breath sounds. Gastrointestinal: Bowel sounds 4 quadrants. Soft and nontender to palpation. No guarding or rigidity. No palpable masses. No distention. No CVA tenderness. Pelvic: No external lesions.  Thin white vaginal discharge.  No cervical motion tenderness. Musculoskeletal: Full range of motion to all extremities. No gross deformities appreciated. Neurologic:  Normal speech and language. No gross focal neurologic deficits are appreciated.  Skin:  Skin is warm, dry and intact. No rash noted. Psychiatric: Mood and affect are normal. Speech and behavior are normal. Patient exhibits appropriate insight and judgement.   ____________________________________________   LABS (all labs ordered are listed, but only abnormal results are displayed)  Labs Reviewed  WET PREP, GENITAL - Abnormal; Notable for the following components:      Result Value   WBC, Wet Prep HPF POC MODERATE (*)     All other components within normal limits  URINALYSIS, COMPLETE (UACMP) WITH MICROSCOPIC - Abnormal; Notable for the following components:   Color, Urine STRAW (*)    APPearance HAZY (*)    Hgb urine dipstick SMALL (*)    Leukocytes,Ua TRACE (*)    All other components within normal limits  CHLAMYDIA/NGC RT PCR ([facility] ONLY)  URINE CULTURE  PREGNANCY, URINE  CBC  COMPREHENSIVE METABOLIC PANEL  POC URINE PREG, ED   ____________________________________________  EKG  SR ____________________________________________  RADIOLOGY   12/17/19 PELVIC COMPLETE W TRANSVAGINAL AND TORSION R/O  Result Date: 12/15/2019 CLINICAL DATA:  Initial evaluation for acute lower abdominal pain for 2-3 days EXAM: TRANSABDOMINAL AND TRANSVAGINAL ULTRASOUND OF PELVIS DOPPLER ULTRASOUND OF OVARIES TECHNIQUE: Both transabdominal and transvaginal ultrasound examinations of the pelvis were performed. Transabdominal technique was performed for global imaging of the pelvis including uterus, ovaries, adnexal  regions, and pelvic cul-de-sac. It was necessary to proceed with endovaginal exam following the transabdominal exam to visualize the uterus, endometrium, and ovaries. Color and duplex Doppler ultrasound was utilized to evaluate blood flow to the ovaries. COMPARISON:  Prior CT from 02/23/2014 FINDINGS: Uterus Measurements: 7.8 x 3.3 x 4.5 cm = volume: 61.5 mL. No fibroids or other mass visualized. Endometrium Thickness: 4.2 mm. No focal abnormality visualized. Small volume simple fluid noted within the endo cervical canal. Right ovary Measurements: 3.2 x 2.1 x 2.3 cm = volume: 8.1 mL. Normal appearance/no adnexal mass. 1.5 x 1.8 x 1.7 cm simple cyst, most consistent with a normal physiologic follicular cyst/dominant follicle. Left ovary Measurements: 3.0 x 1.7 x 2.9 cm = volume: 7.8 mL. Normal appearance/no adnexal mass. Pulsed Doppler evaluation of both ovaries demonstrates normal low-resistance arterial and venous  waveforms. Other findings No abnormal free fluid. IMPRESSION: 1. 1.8 cm simple right ovarian cyst, most consistent with a dominant follicle/normal physiologic follicular cyst. 2. Otherwise unremarkable pelvic ultrasound for age. No evidence for torsion or other acute abnormality. Electronically Signed   By: Rise Mu M.D.   On: 12/15/2019 23:27    ____________________________________________    PROCEDURES  Procedure(s) performed:    Procedures    Medications  metroNIDAZOLE (FLAGYL) tablet 500 mg (has no administration in time range)     ____________________________________________   INITIAL IMPRESSION / ASSESSMENT AND PLAN / ED COURSE  Pertinent labs & imaging results that were available during my care of the patient were reviewed by me and considered in my medical decision making (see chart for details).  Review of the [place] CSRS was performed in accordance of the [facility] prior to dispensing any controlled drugs.  Differential diagnosis includes, but is not limited to, ovarian cyst, ovarian torsion, acute appendicitis, diverticulitis, urinary tract infection/pyelonephritis, endometriosis, bowel obstruction, colitis, renal colic, gastroenteritis, hernia, fibroids, endometriosis, pregnancy related pain including ectopic pregnancy, etc.   Patient presented to emergency department for evaluation of primarily vaginal discharge and dysuria for several days.  Vital signs and exam are reassuring.  Lab work largely unremarkable.  Ultrasound consistent with right simple ovarian cyst.  Wet prep shows white blood cells.  Gonorrhea and Chlamydia are negative.  Urinalysis shows small hemoglobin and trace leukocytes. Urine was sent for culture.  Patient does say she does have some vaginal spotting.  She has never had a kidney stone.  Patient denies any flank pain.  Low suspicion for kidney stone due to symptoms and that pain is in her very low back and bilateral.  We discussed doing a CT scan  to further evaluate and patient would like to hold off at this time, which I feel is reasonable.  Patient states that her symptoms feel consistent with her previous bacterial vaginosis.  Her pelvic exam is also consistent with bacterial vaginosis.  She would like to try medication and will return to the emergency department if symptoms worsen.  Patient will be discharged home with prescriptions for Flagyl.  Patient is to follow up with primary care and OB as directed. Patient is given ED precautions to return to the ED for any worsening or new symptoms.   Brandy Ward was evaluated in Emergency Department on 12/15/2019 for the symptoms described in the history of present illness. She was evaluated in the context of the global COVID-19 pandemic, which necessitated consideration that the patient might be at risk for infection with the SARS-CoV-2 virus that causes COVID-19. Institutional protocols and algorithms that pertain  to the evaluation of patients at risk for COVID-19 are in a state of rapid change based on information released by regulatory bodies including the CDC and federal and state organizations. These policies and algorithms were followed during the patient's care in the ED.  ____________________________________________  FINAL CLINICAL IMPRESSION(S) / ED DIAGNOSES  Final diagnoses:  Lower abdominal pain  Vaginal discharge  Dysuria      NEW MEDICATIONS STARTED DURING THIS VISIT:  ED Discharge Orders         Ordered    metroNIDAZOLE (FLAGYL) 500 MG tablet  2 times daily     12/15/19 2335              This chart was dictated using voice recognition software/Dragon. Despite best efforts to proofread, errors can occur which can change the meaning. Any change was purely unintentional.    Laban Emperor, PA-C 12/16/19 Christophe Louis, MD 12/20/19 [phone]

## 2019-12-17 LAB — URINE CULTURE

## 2019-12-22 ENCOUNTER — Emergency Department: Payer: Medicaid Other

## 2019-12-22 ENCOUNTER — Other Ambulatory Visit: Payer: Self-pay

## 2019-12-22 ENCOUNTER — Encounter: Payer: Self-pay | Admitting: Emergency Medicine

## 2019-12-22 ENCOUNTER — Emergency Department
Admission: EM | Admit: 2019-12-22 | Discharge: 2019-12-22 | Disposition: A | Discharge status: Home / Self Care | Payer: Medicaid Other | Attending: Emergency Medicine | Admitting: Emergency Medicine

## 2019-12-22 DIAGNOSIS — Z79899 Other long term (current) drug therapy: Secondary | ICD-10-CM | POA: Diagnosis not present

## 2019-12-22 DIAGNOSIS — Z91040 Latex allergy status: Secondary | ICD-10-CM | POA: Diagnosis not present

## 2019-12-22 DIAGNOSIS — J45909 Unspecified asthma, uncomplicated: Secondary | ICD-10-CM | POA: Insufficient documentation

## 2019-12-22 DIAGNOSIS — R109 Unspecified abdominal pain: Secondary | ICD-10-CM | POA: Diagnosis present

## 2019-12-22 DIAGNOSIS — N3000 Acute cystitis without hematuria: Secondary | ICD-10-CM | POA: Diagnosis not present

## 2019-12-22 LAB — BASIC METABOLIC PANEL
Anion gap: 7 (ref 5–15)
BUN: 9 mg/dL (ref 6–20)
CO2: 26 mmol/L (ref 22–32)
Calcium: 8.7 mg/dL — ABNORMAL LOW (ref 8.9–10.3)
Chloride: 102 mmol/L (ref 98–111)
Creatinine, Ser: 0.61 mg/dL (ref 0.44–1.00)
GFR calc Af Amer: >60 mL/min (ref >60)
GFR calc non Af Amer: >60 mL/min (ref >60)
Glucose, Bld: 120 mg/dL — ABNORMAL HIGH (ref 70–99)
Potassium: 3.7 mmol/L (ref 3.5–5.1)
Sodium: 135 mmol/L (ref 135–145)

## 2019-12-22 LAB — CBC
HCT: 35.6 % — ABNORMAL LOW (ref 36.0–46.0)
Hemoglobin: 12.3 g/dL (ref 12.0–15.0)
MCH: 32.2 pg (ref 26.0–34.0)
MCHC: 34.6 g/dL (ref 30.0–36.0)
MCV: 93.2 fL (ref 80.0–100.0)
Platelets: 243 10*3/uL (ref 150–400)
RBC: 3.82 MIL/uL — ABNORMAL LOW (ref 3.87–5.11)
RDW: 12.2 % (ref 11.5–15.5)
WBC: 10.3 10*3/uL (ref 4.0–10.5)
nRBC: 0 % (ref 0.0–0.2)

## 2019-12-22 LAB — URINALYSIS, COMPLETE (UACMP) WITH MICROSCOPIC
Bacteria, UA: NONE SEEN
Bilirubin Urine: NEGATIVE
Glucose, UA: NEGATIVE mg/dL
Ketones, ur: NEGATIVE mg/dL
Nitrite: NEGATIVE
Protein, ur: NEGATIVE mg/dL
Specific Gravity, Urine: 1.023 (ref 1.005–1.030)
pH: 6 (ref 5.0–8.0)

## 2019-12-22 LAB — POCT PREGNANCY, URINE: Preg Test, Ur: NEGATIVE

## 2019-12-22 MED ORDER — KETOROLAC TROMETHAMINE 30 MG/ML IJ SOLN
30.0000 mg | Freq: Once | INTRAMUSCULAR | Status: AC
  Administered 2019-12-22: 30 mg via INTRAMUSCULAR
  Filled 2019-12-22: qty 1

## 2019-12-22 MED ORDER — CEPHALEXIN 500 MG PO CAPS
1000.0000 mg | ORAL_CAPSULE | Freq: Once | ORAL | Status: AC
  Administered 2019-12-22: 1000 mg via ORAL
  Filled 2019-12-22: qty 2

## 2019-12-22 MED ORDER — CEPHALEXIN 500 MG PO CAPS
500.0000 mg | ORAL_CAPSULE | Freq: Two times a day (BID) | ORAL | 0 refills | Status: AC
Start: 2019-12-22 — End: 2019-12-29

## 2019-12-22 NOTE — ED Triage Notes (Signed)
Pt was seen here recently for dysuria that has gotten worse. C/o bilateral flank pain and lower abdominal pain. No fever. Ambulatory. NAD.

## 2019-12-22 NOTE — ED Notes (Signed)
Pt taken to CT.

## 2019-12-22 NOTE — ED Provider Notes (Signed)
[facility] Emergency Department Provider Note   ____________________________________________   First MD Initiated Contact with Patient 12/22/19 1020     (approximate)  I have reviewed the triage vital signs and the nursing notes.   HISTORY  Chief Complaint Dysuria, Flank Pain, and Abdominal Pain    HPI Brandy Ward is a 24 y.o. female with possible history of asthma, migraines, and GERD who presents to the ED complaining of abdominal pain. Patient reports that she initially started having dysuria last week and was seen in the ED for this. In that time, she was told that her urine testing was negative and that she likely has BV. She has been taking the Flagyl without any improvement in her symptoms, now states that the dysuria has gotten worse and she is having suprapubic abdominal pain. The pain will occasionally move up into both of her flanks, but she denies any fevers, nausea, or vomiting. She denies any history of kidney stones. She has not had any vaginal bleeding or discharge.        Past Medical History:  Diagnosis Date  . Asthma   . Frequent headaches    migraine  . GERD (gastroesophageal reflux disease)   . Herpes   . PONV (postoperative nausea and vomiting)     Patient Active Problem List   Diagnosis Date Noted  . Asthma without status asthmaticus 03/18/2019  . Learning disability 04/14/2017    Past Surgical History:  Procedure Laterality Date  . CESAREAN SECTION N/A 10/05/2017   Procedure: CESAREAN SECTION;  Surgeon: Christeen Douglas, MD;  Location: [facility] ORS;  Service: Obstetrics;  Laterality: N/A;  . EYE SURGERY    . WISDOM TOOTH EXTRACTION      Prior to Admission medications   Medication Sig Start Date End Date Taking? Authorizing Provider  albuterol (PROVENTIL HFA;VENTOLIN HFA) 108 (90 Base) MCG/ACT inhaler Inhale 2 puffs into the lungs every 6 (six) hours as needed for wheezing or shortness of breath.    [provider]  cephALEXin (KEFLEX) 500 MG capsule Take 1 capsule (500 mg total) by mouth 2 (two) times daily for 7 days. 12/22/19 12/29/19  Chesley Noon, MD  cetirizine (ZYRTEC) 10 MG tablet Take 10 mg by mouth daily.    [provider]  ferrous sulfate 325 (65 FE) MG tablet Take 1 tablet (325 mg total) by mouth daily with breakfast. Take with Vitamin C 10/08/17 12/07/17  Christeen Douglas, MD  ibuprofen (ADVIL,MOTRIN) 800 MG tablet Take 1 tablet (800 mg total) by mouth every 8 (eight) hours as needed for moderate pain or cramping. 10/08/17   Christeen Douglas, MD  metroNIDAZOLE (FLAGYL) 500 MG tablet Take 1 tablet (500 mg total) by mouth 2 (two) times daily for 7 days. 12/15/19 12/22/19  Enid Derry, PA-C  mupirocin ointment Idelle Jo) 2 % Apply to affected area 3 times daily 01/22/19 01/22/20  Orvil Feil, PA-C  omeprazole (PRILOSEC) 40 MG capsule Take 40 mg by mouth daily.    [provider]    Allergies Latex  Family History  Problem Relation Age of Onset  . Hypertension Mother   . Asthma Mother   . Anxiety disorder Mother   . Depression Mother   . Fibroids Mother   . Diabetes Maternal Grandmother   . Asthma Maternal Grandmother   . Cervical cancer Maternal Grandmother   . Hypertension Maternal Grandmother   . Asthma Sister   . Diabetes Maternal Aunt   . Brain cancer Paternal Grandfather   .  Lung cancer Paternal Grandfather     Social History Social History   Tobacco Use  . Smoking status: Never Smoker  . Smokeless tobacco: Never Used  Substance Use Topics  . Alcohol use: No  . Drug use: No    Review of Systems  Constitutional: No fever/chills Eyes: No visual changes. ENT: No sore throat. Cardiovascular: Denies chest pain. Respiratory: Denies shortness of breath. Gastrointestinal: Positive for flank and abdominal pain.  No nausea, no vomiting.  No diarrhea.  No constipation. Genitourinary: Positive for dysuria. Musculoskeletal: Negative for back pain. Skin:  Negative for rash. Neurological: Negative for headaches, focal weakness or numbness.  ____________________________________________   PHYSICAL EXAM:  VITAL SIGNS: ED Triage Vitals  Enc Vitals Group     BP 12/22/19 0848 135/74     Pulse Rate 12/22/19 0848 90     Resp 12/22/19 0848 16     Temp 12/22/19 0848 98.4 F (36.9 C)     Temp Source 12/22/19 0848 Oral     SpO2 12/22/19 0848 100 %     Weight 12/22/19 0846 170 lb (77.1 kg)     Height 12/22/19 0846 5\' 7"  (1.702 m)     Head Circumference --      Peak Flow --      Pain Score 12/22/19 0846 10     Pain Loc --      Pain Edu? --      Excl. in GC? --     Constitutional: Alert and oriented. Eyes: Conjunctivae are normal. Head: Atraumatic. Nose: No congestion/rhinnorhea. Mouth/Throat: Mucous membranes are moist. Neck: Normal ROM Cardiovascular: Normal rate, regular rhythm. Grossly normal heart sounds. Respiratory: Normal respiratory effort.  No retractions. Lungs CTAB. Gastrointestinal: Soft and tender to palpation in suprapubic area with no rebound or guarding. No CVA tenderness bilaterally. No distention. Genitourinary: deferred Musculoskeletal: No lower extremity tenderness nor edema. Neurologic:  Normal speech and language. No gross focal neurologic deficits are appreciated. Skin:  Skin is warm, dry and intact. No rash noted. Psychiatric: Mood and affect are normal. Speech and behavior are normal.  ____________________________________________   LABS (all labs ordered are listed, but only abnormal results are displayed)  Labs Reviewed  URINALYSIS, COMPLETE (UACMP) WITH MICROSCOPIC - Abnormal; Notable for the following components:      Result Value   Color, Urine YELLOW (*)    APPearance HAZY (*)    Hgb urine dipstick SMALL (*)    Leukocytes,Ua TRACE (*)    All other components within normal limits  BASIC METABOLIC PANEL - Abnormal; Notable for the following components:   Glucose, Bld 120 (*)    Calcium 8.7 (*)     All other components within normal limits  CBC - Abnormal; Notable for the following components:   RBC 3.82 (*)    HCT 35.6 (*)    All other components within normal limits  URINE CULTURE  POC URINE PREG, ED  POCT PREGNANCY, URINE     PROCEDURES  Procedure(s) performed (including Critical Care):  Procedures   ____________________________________________   INITIAL IMPRESSION / ASSESSMENT AND PLAN / ED COURSE       24 year old female presents to the ED complaining of 1 week of worsening dysuria as well as suprapubic discomfort and flank pain. She has not had any relief of symptoms with course of Flagyl and at this point UTI seems more likely than BV. She did have overall reassuring pelvic exam previously and I doubt PID, gonorrhea and Chlamydia testing was also negative  during last visit. She appears overall well and no CVA tenderness to suggest pyelonephritis. We will further assessed with CT for nephrolithiasis, treat pain with Toradol and give initial dose of antibiotics. If CT is unremarkable, she would be appropriate for discharge home with antibiotics for UTI.  CT is negative for nephrolithiasis or other acute process.  Patient tolerating p.o. without difficulty here in the ED with minimal pain and is requesting to be discharged home.  We will start her on Keflex for apparent UTI and she was counseled to follow-up with PCP, otherwise return to the ED for new or worsening symptoms.  Patient agrees with plan.      ____________________________________________   FINAL CLINICAL IMPRESSION(S) / ED DIAGNOSES  Final diagnoses:  Acute cystitis without hematuria     ED Discharge Orders         Ordered    cephALEXin (KEFLEX) 500 MG capsule  2 times daily     12/22/19 1252           Note:  This document was prepared using Dragon voice recognition software and may include unintentional dictation errors.   Blake Divine, MD 12/22/19 1252

## 2019-12-22 NOTE — ED Notes (Signed)
This RN spoke with lab to add urine culture.

## 2019-12-24 LAB — URINE CULTURE: Culture: >=100000 — AB

## 2019-12-25 NOTE — Progress Notes (Signed)
ED Antimicrobial Stewardship Positive Culture Follow Up   Brandy Ward is an 24 y.o. female who presented to [facility] on 12/22/2019 with a chief complaint of  Chief Complaint  Patient presents with  . Dysuria  . Flank Pain  . Abdominal Pain   Urine culture results > 100k staphylococcus saphrophyticus, oxacillin resistant.   [x]  Treated with cephalexin, organism resistant to prescribed antimicrobial []  Patient discharged originally without antimicrobial agent and treatment is now indicated  New antibiotic prescription: Bactrim DS 1 tab PO BID x 7 days  ED Provider:  Rx called in to [facility] in [place]. Discussed with patient.   Orvie Caradine C 12/25/2019, 2:00 PM Clinical Pharmacist

## 2020-01-09 ENCOUNTER — Other Ambulatory Visit: Payer: Self-pay

## 2020-01-09 ENCOUNTER — Encounter: Payer: Self-pay | Admitting: Emergency Medicine

## 2020-01-09 DIAGNOSIS — Z79899 Other long term (current) drug therapy: Secondary | ICD-10-CM | POA: Insufficient documentation

## 2020-01-09 DIAGNOSIS — K120 Recurrent oral aphthae: Secondary | ICD-10-CM | POA: Diagnosis not present

## 2020-01-09 DIAGNOSIS — J45909 Unspecified asthma, uncomplicated: Secondary | ICD-10-CM | POA: Diagnosis not present

## 2020-01-09 DIAGNOSIS — K0889 Other specified disorders of teeth and supporting structures: Secondary | ICD-10-CM | POA: Diagnosis present

## 2020-01-09 NOTE — ED Triage Notes (Signed)
Patient states that she was seen at the dentist on Tuesday and had some work done. patient states that she started having pain and swelling to the top of her mouth.

## 2020-01-10 ENCOUNTER — Emergency Department
Admission: EM | Admit: 2020-01-10 | Discharge: 2020-01-10 | Disposition: A | Discharge status: Home / Self Care | Payer: Medicaid Other | Attending: Emergency Medicine | Admitting: Emergency Medicine

## 2020-01-10 DIAGNOSIS — K120 Recurrent oral aphthae: Secondary | ICD-10-CM

## 2020-01-10 MED ORDER — LIDOCAINE VISCOUS HCL 2 % MT SOLN
15.0000 mL | OROMUCOSAL | 0 refills | Status: AC | PRN
Start: 2020-01-10 — End: ?

## 2020-01-10 MED ORDER — IBUPROFEN 600 MG PO TABS
600.0000 mg | ORAL_TABLET | Freq: Once | ORAL | Status: AC
  Administered 2020-01-10: 600 mg via ORAL
  Filled 2020-01-10: qty 1

## 2020-01-10 MED ORDER — LIDOCAINE VISCOUS HCL 2 % MT SOLN
15.0000 mL | Freq: Once | OROMUCOSAL | Status: AC
  Administered 2020-01-10: 15 mL via OROMUCOSAL
  Filled 2020-01-10: qty 15

## 2020-01-10 NOTE — ED Provider Notes (Signed)
[facility] Emergency Department Provider Note  ____________________________________________  Time seen: Approximately 2:44 AM  I have reviewed the triage vital signs and the nursing notes.   HISTORY  Chief Complaint Dental Pain   HPI Brandy Ward is a 24 y.o. female who presents for evaluation of mouth sores.  Patient reports several days of pain and soreness in her upper gum. No tooth pain, no fever or chills. The pain is sharp and throbbing, constant and moderate in intensity.  Past Medical History:  Diagnosis Date  . Asthma   . Frequent headaches    migraine  . GERD (gastroesophageal reflux disease)   . Herpes   . PONV (postoperative nausea and vomiting)     Patient Active Problem List   Diagnosis Date Noted  . Asthma without status asthmaticus 03/18/2019  . Learning disability 04/14/2017    Past Surgical History:  Procedure Laterality Date  . CESAREAN SECTION N/A 10/05/2017   Procedure: CESAREAN SECTION;  Surgeon: Christeen Douglas, MD;  Location: [facility] ORS;  Service: Obstetrics;  Laterality: N/A;  . EYE SURGERY    . WISDOM TOOTH EXTRACTION      Prior to Admission medications   Medication Sig Start Date End Date Taking? Authorizing Provider  albuterol (PROVENTIL HFA;VENTOLIN HFA) 108 (90 Base) MCG/ACT inhaler Inhale 2 puffs into the lungs every 6 (six) hours as needed for wheezing or shortness of breath.    [provider]  cetirizine (ZYRTEC) 10 MG tablet Take 10 mg by mouth daily.    [provider]  ferrous sulfate 325 (65 FE) MG tablet Take 1 tablet (325 mg total) by mouth daily with breakfast. Take with Vitamin C 10/08/17 12/07/17  Christeen Douglas, MD  ibuprofen (ADVIL,MOTRIN) 800 MG tablet Take 1 tablet (800 mg total) by mouth every 8 (eight) hours as needed for moderate pain or cramping. 10/08/17   Christeen Douglas, MD  lidocaine (XYLOCAINE) 2 % solution Use as directed 15 mLs in the mouth or throat as needed for  mouth pain. 01/10/20   Nita Sickle, MD  mupirocin ointment Idelle Jo) 2 % Apply to affected area 3 times daily 01/22/19 01/22/20  Orvil Feil, PA-C  omeprazole (PRILOSEC) 40 MG capsule Take 40 mg by mouth daily.    [provider]    Allergies Latex  Family History  Problem Relation Age of Onset  . Hypertension Mother   . Asthma Mother   . Anxiety disorder Mother   . Depression Mother   . Fibroids Mother   . Diabetes Maternal Grandmother   . Asthma Maternal Grandmother   . Cervical cancer Maternal Grandmother   . Hypertension Maternal Grandmother   . Asthma Sister   . Diabetes Maternal Aunt   . Brain cancer Paternal Grandfather   . Lung cancer Paternal Grandfather     Social History Social History   Tobacco Use  . Smoking status: Never Smoker  . Smokeless tobacco: Never Used  Substance Use Topics  . Alcohol use: No  . Drug use: No    Review of Systems  Constitutional: Negative for fever. Eyes: Negative for visual changes. ENT: Negative for sore throat. + mouth pain Neck: No neck pain  Cardiovascular: Negative for chest pain. Respiratory: Negative for shortness of breath. Gastrointestinal: Negative for abdominal pain, vomiting or diarrhea. Genitourinary: Negative for dysuria. Musculoskeletal: Negative for back pain. Skin: Negative for rash. Neurological: Negative for headaches, weakness or numbness. Psych: No SI or HI  ____________________________________________   PHYSICAL EXAM:  VITAL SIGNS: ED Triage Vitals [01/09/20 2341]  Enc Vitals Group     BP (!) 124/52     Pulse Rate 75     Resp 18     Temp 98.1 F (36.7 C)     Temp Source Oral     SpO2 99 %     Weight 170 lb (77.1 kg)     Height 5\' 6"  (1.676 m)     Head Circumference      Peak Flow      Pain Score 9     Pain Loc      Pain Edu?      Excl. in [place]?     Constitutional: Alert and oriented. Well appearing and in no apparent distress. HEENT:      Head: Normocephalic and  atraumatic.         Eyes: Conjunctivae are normal. Sclera is non-icteric.       Mouth/Throat: Mucous membranes are moist. Two upper aphthous ulcers on the upper gum      Neck: Supple with no signs of meningismus. Cardiovascular: Regular rate and rhythm.  Respiratory: Normal respiratory effort.  Neurologic: Normal speech and language. Face is symmetric. Moving all extremities. No gross focal neurologic deficits are appreciated. Skin: Skin is warm, dry and intact. No rash noted. Psychiatric: Mood and affect are normal. Speech and behavior are normal.  ____________________________________________   LABS (all labs ordered are listed, but only abnormal results are displayed)  Labs Reviewed - No data to display ____________________________________________  EKG  none  ____________________________________________  RADIOLOGY  none  ____________________________________________   PROCEDURES  Procedure(s) performed: None Procedures Critical Care performed:  None ____________________________________________   INITIAL IMPRESSION / ASSESSMENT AND PLAN / ED COURSE   24 y.o. female who presents for evaluation of mouth sores. On exam patient has 2 aphthous ulcers, otherwise exam is reassuring. Recommended applying baking soda several times a day. Will give a prescription for viscous lidocaine for pain. Ibuprofen or Tylenol. Avoid citrus foods and drinks. Discussed my standard return precautions and follow-up with your primary care doctor.      _____________________________________________ Please note:  Patient was evaluated in Emergency Department today for the symptoms described in the history of present illness. Patient was evaluated in the context of the global COVID-19 pandemic, which necessitated consideration that the patient might be at risk for infection with the SARS-CoV-2 virus that causes COVID-19. Institutional protocols and algorithms that pertain to the evaluation of patients  at risk for COVID-19 are in a state of rapid change based on information released by regulatory bodies including the CDC and federal and state organizations. These policies and algorithms were followed during the patient's care in the ED.  Some ED evaluations and interventions may be delayed as a result of limited staffing during the pandemic.   [place] Controlled Substance Database was reviewed by me. ____________________________________________   FINAL CLINICAL IMPRESSION(S) / ED DIAGNOSES   Final diagnoses:  Oral aphthous ulcer      NEW MEDICATIONS STARTED DURING THIS VISIT:  ED Discharge Orders         Ordered    lidocaine (XYLOCAINE) 2 % solution  As needed     01/10/20 0244           Note:  This document was prepared using Dragon voice recognition software and may include unintentional dictation errors.    Alfred Levins, [place], MD 01/10/20 [phone]

## 2020-01-10 NOTE — Discharge Instructions (Signed)
You should apply a small amount of baking powder over the ulcers several times a day to minimize pain and expedite healing. Use the viscous lidocaine prescribed as needed for pain. You may also take ibuprofen or tylenol for pain.

## 2020-08-14 ENCOUNTER — Emergency Department
Admission: EM | Admit: 2020-08-14 | Discharge: 2020-08-14 | Discharge status: Against Medical Advice | Payer: Medicaid Other

## 2020-08-14 NOTE — ED Notes (Signed)
Pt called in the WR with no response

## 2020-08-14 NOTE — ED Notes (Signed)
Pt called in the WR with no response 

## 2021-06-30 IMAGING — US US PELVIS COMPLETE TRANSABD/TRANSVAG W DUPLEX
1 series · 13 of 25 positions shown · non-contrast
Comparison: Prior CT from 02/23/2014

CLINICAL DATA: Initial evaluation for acute lower abdominal pain
for 2-3 days

EXAM:
TRANSABDOMINAL AND TRANSVAGINAL ULTRASOUND OF PELVIS
DOPPLER ULTRASOUND OF OVARIES
TECHNIQUE: Both transabdominal and transvaginal ultrasound examinations of the
pelvis were performed. Transabdominal technique was performed for
global imaging of the pelvis including uterus, ovaries, adnexal
regions, and pelvic cul-de-sac.
It was necessary to proceed with endovaginal exam following the
transabdominal exam to visualize the uterus, endometrium, and
ovaries. Color and duplex Doppler ultrasound was utilized to
evaluate blood flow to the ovaries.

[Series 1: us pelvic complete w transvaginal and torsion righ · 13 of 103 slices shown]
[im 1/103]
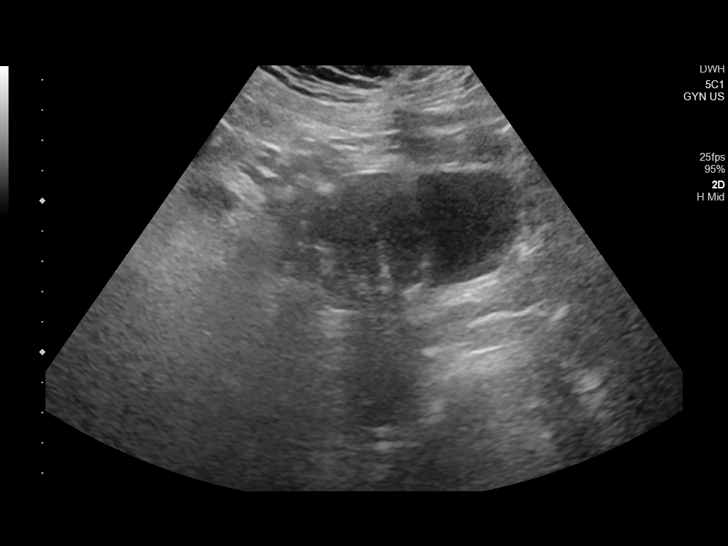
[im 9/103]
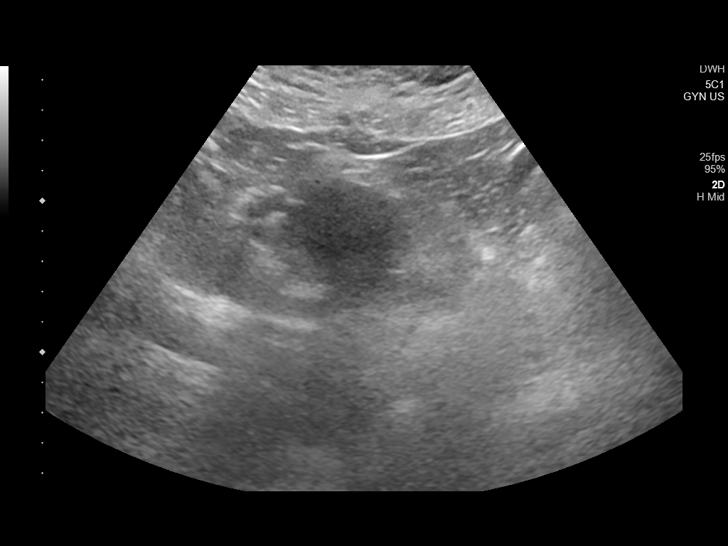
[im 18/103]
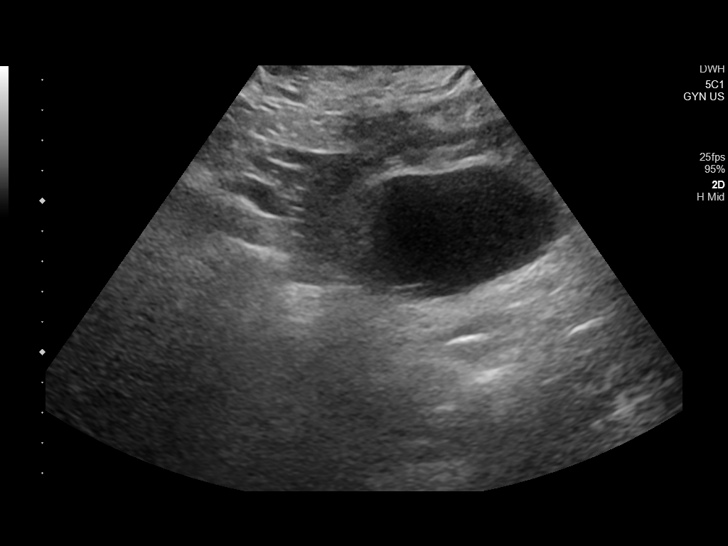
[im 26/103]
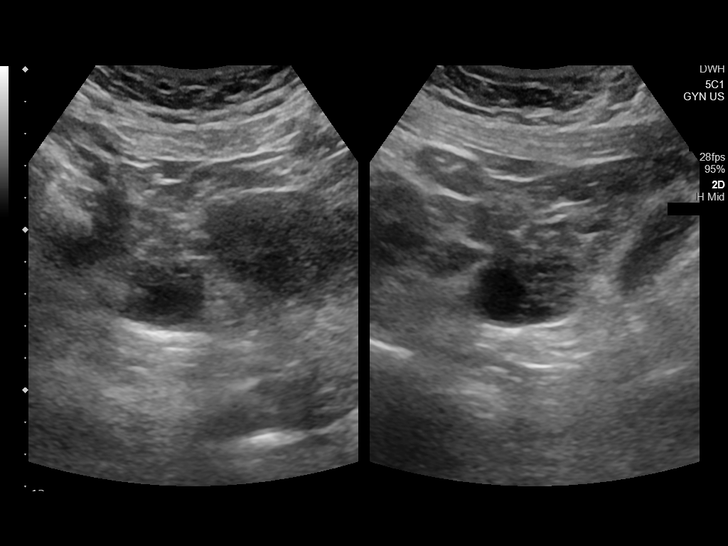
[im 35/103]
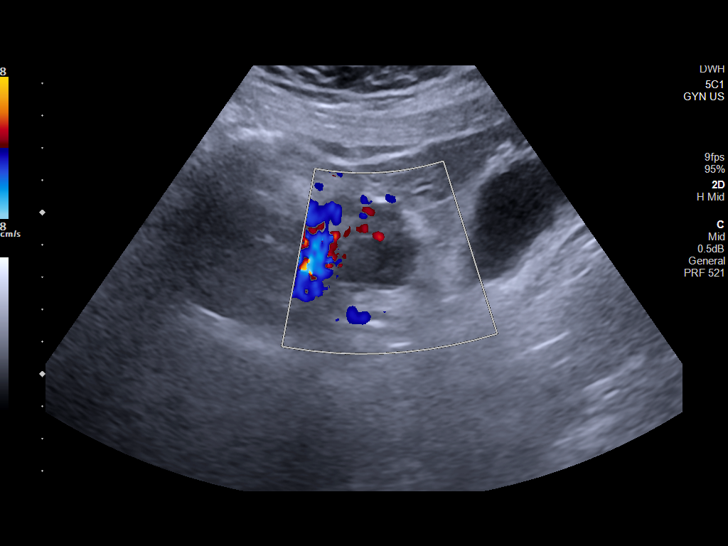
[im 43/103]
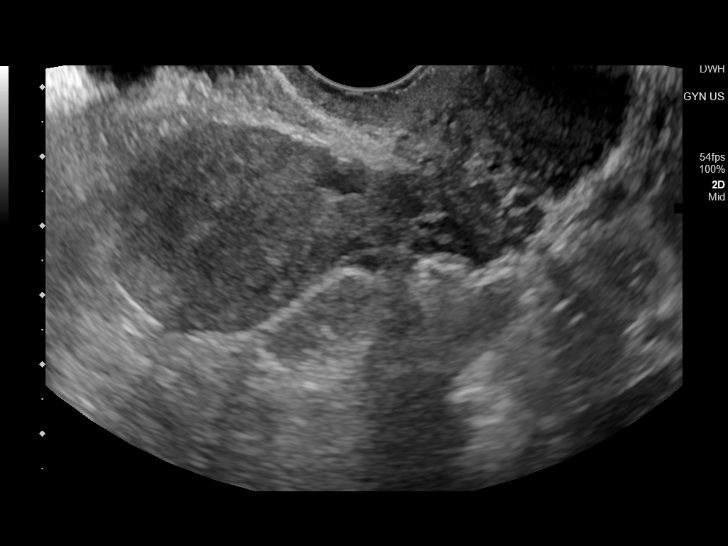
[im 52/103]
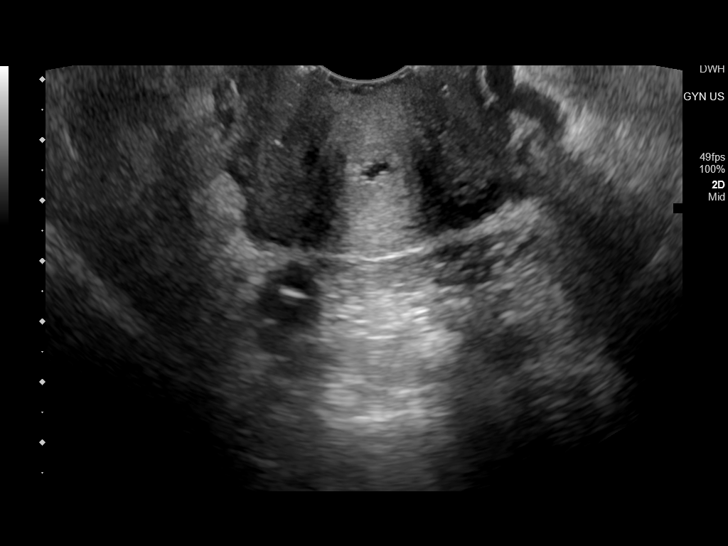
[im 60/103]
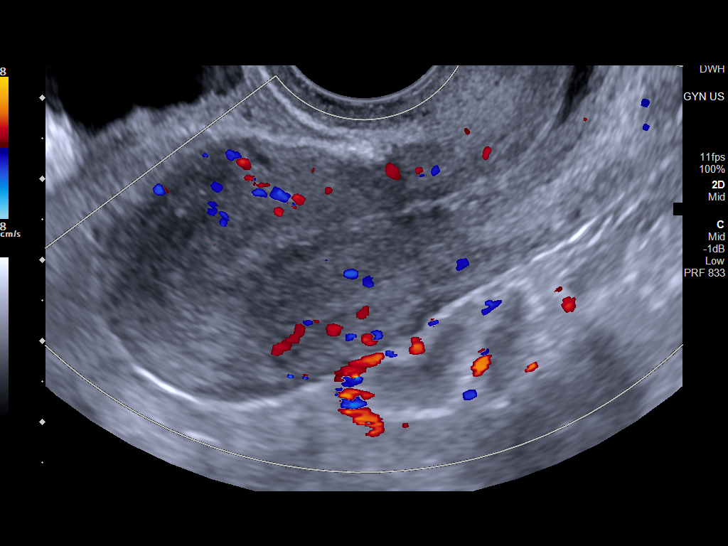
[im 69/103]
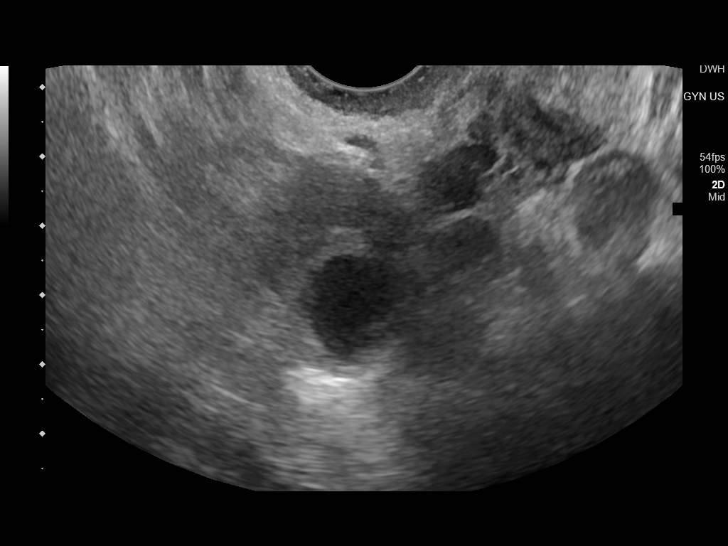
[im 77/103]
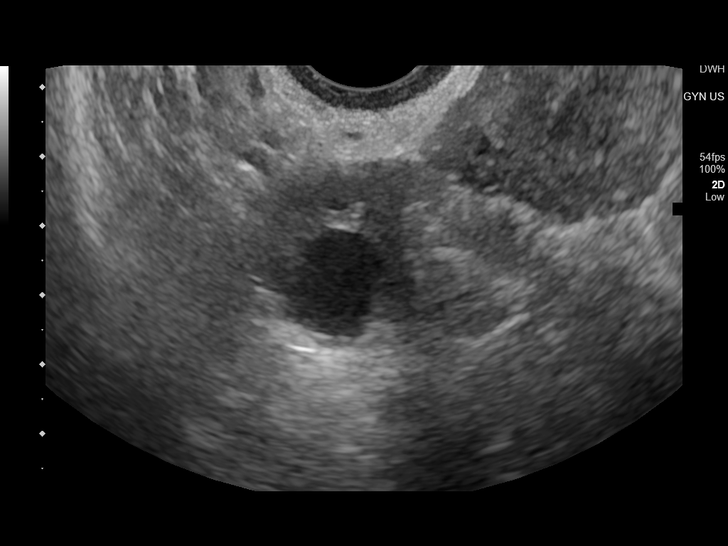
[im 86/103]
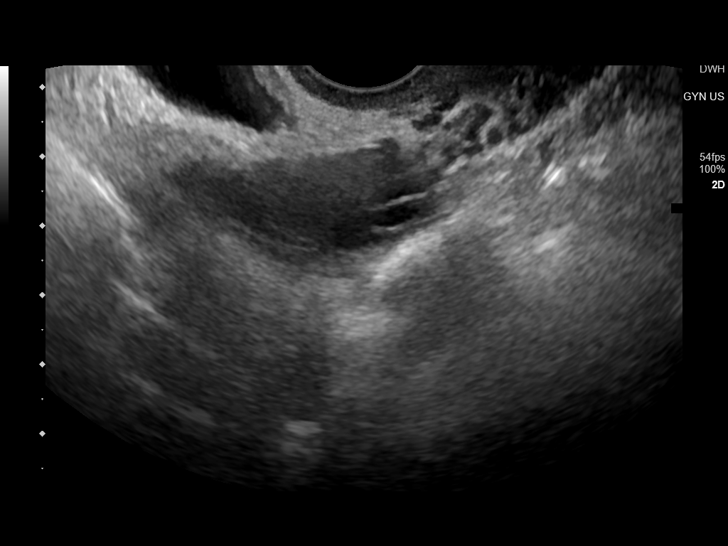
[im 94/103]
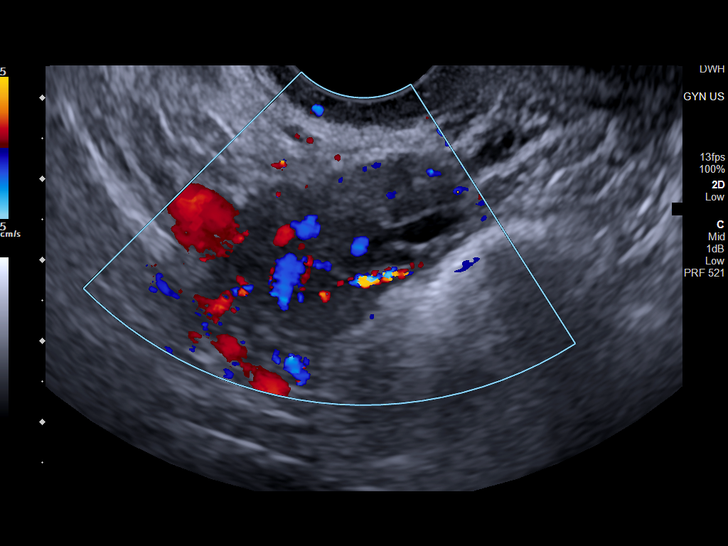
[im 103/103]
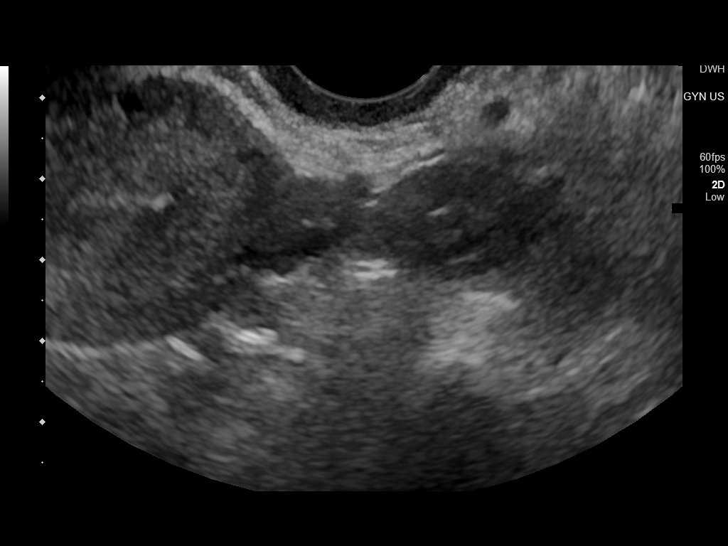

[13 of 25 positions shown; findings below may reference images not displayed]

FINDINGS: Uterus

Measurements: 7.8 x 3.3 x 4.5 cm = volume: 61.5 mL. No fibroids or
other mass visualized.

Endometrium

Thickness: 4.2 mm. No focal abnormality visualized. Small volume
simple fluid noted within the endo cervical canal.

Right ovary

Measurements: 3.2 x 2.1 x 2.3 cm = volume: 8.1 mL. Normal
appearance/no adnexal mass. 1.5 x 1.8 x 1.7 cm simple cyst, most
consistent with a normal physiologic follicular cyst/dominant
follicle.

Left ovary

Measurements: 3.0 x 1.7 x 2.9 cm = volume: 7.8 mL. Normal
appearance/no adnexal mass.

Pulsed Doppler evaluation of both ovaries demonstrates normal
low-resistance arterial and venous waveforms.

Other findings

No abnormal free fluid.
IMPRESSION: 1. 1.8 cm simple right ovarian cyst, most consistent with a dominant
follicle/normal physiologic follicular cyst.
2. Otherwise unremarkable pelvic ultrasound for age. No evidence for
torsion or other acute abnormality.

## 2021-10-23 ENCOUNTER — Ambulatory Visit: Payer: Self-pay | Admitting: Internal Medicine

## 2022-02-18 ENCOUNTER — Emergency Department
Admission: EM | Admit: 2022-02-18 | Discharge: 2022-02-18 | Discharge status: Against Medical Advice | Payer: Medicaid Other | Attending: Emergency Medicine | Admitting: Emergency Medicine

## 2022-02-18 ENCOUNTER — Encounter: Payer: Self-pay | Admitting: Emergency Medicine

## 2022-02-18 NOTE — ED Triage Notes (Addendum)
Pt arrived via [facility] from home with c/o bilateral lower back pain and dysuria x1 day. Pt reported she had witnessed syncopal episode earlier in day but refused medical treatment. Pt A&O x4 on arrival to ED. Pt denies hitting head with earlier LOC.

## 2022-03-07 ENCOUNTER — Ambulatory Visit: Payer: Medicaid Other | Admitting: Internal Medicine

## 2022-03-07 NOTE — Progress Notes (Deleted)
HPI  Patient presents to clinic today to establish care and for management of the conditions listed below.  Asthma: She denies chronic cough or shortness of breath.  She is using Albuterol as needed.  There are no PFTs on file.  GERD: Triggered by.  She denies breakthrough on Omeprazole.  There is no upper GI on file.  Frequent Headaches: These occur.  Triggered by.  She takes Ibuprofen as needed with good relief of symptoms.  She does not follow with neurology.  Iron Deficiency Anemia: Her last H/H was 12.3/35.6, 2021.  She is taking oral iron as prescribed.  She does not follow with hematology.  Past Medical History:  Diagnosis Date   Asthma    Frequent headaches    migraine   GERD (gastroesophageal reflux disease)    Herpes    PONV (postoperative nausea and vomiting)     Current Outpatient Medications  Medication Sig Dispense Refill   albuterol (PROVENTIL HFA;VENTOLIN HFA) 108 (90 Base) MCG/ACT inhaler Inhale 2 puffs into the lungs every 6 (six) hours as needed for wheezing or shortness of breath.     cetirizine (ZYRTEC) 10 MG tablet Take 10 mg by mouth daily.     ferrous sulfate 325 (65 FE) MG tablet Take 1 tablet (325 mg total) by mouth daily with breakfast. Take with Vitamin C 30 tablet 1   ibuprofen (ADVIL,MOTRIN) 800 MG tablet Take 1 tablet (800 mg total) by mouth every 8 (eight) hours as needed for moderate pain or cramping. 30 tablet 1   lidocaine (XYLOCAINE) 2 % solution Use as directed 15 mLs in the mouth or throat as needed for mouth pain. 150 mL 0   omeprazole (PRILOSEC) 40 MG capsule Take 40 mg by mouth daily.     No current facility-administered medications for this visit.    Allergies  Allergen Reactions   Latex Dermatitis    condoms    Family History  Problem Relation Age of Onset   Hypertension Mother    Asthma Mother    Anxiety disorder Mother    Depression Mother    Fibroids Mother    Diabetes Maternal Grandmother    Asthma Maternal Grandmother     Cervical cancer Maternal Grandmother    Hypertension Maternal Grandmother    Asthma Sister    Diabetes Maternal Aunt    Brain cancer Paternal Grandfather    Lung cancer Paternal Grandfather     Social History   Socioeconomic History   Marital status: Single    Spouse name: Not on file   Number of children: Not on file   Years of education: Not on file   Highest education level: Not on file  Occupational History   Not on file  Tobacco Use   Smoking status: Never   Smokeless tobacco: Never  Substance and Sexual Activity   Alcohol use: No   Drug use: No   Sexual activity: Yes    Birth control/protection: Implant  Other Topics Concern   Not on file  Social History Narrative   Not on file   Social Determinants of Health   Financial Resource Strain: Not on file  Food Insecurity: Not on file  Transportation Needs: Not on file  Physical Activity: Not on file  Stress: Not on file  Social Connections: Not on file  Intimate Partner Violence: Not on file    ROS:  Constitutional: Patient reports frequent headaches.  Denies fever, malaise, fatigue, or abrupt weight changes.  HEENT: Denies eye pain, eye  redness, ear pain, ringing in the ears, wax buildup, runny nose, nasal congestion, bloody nose, or sore throat. Respiratory: Denies difficulty breathing, shortness of breath, cough or sputum production.   Cardiovascular: Denies chest pain, chest tightness, palpitations or swelling in the hands or feet.  Gastrointestinal: Denies abdominal pain, bloating, constipation, diarrhea or blood in the stool.  GU: Denies frequency, urgency, pain with urination, blood in urine, odor or discharge. Musculoskeletal: Denies decrease in range of motion, difficulty with gait, muscle pain or joint pain and swelling.  Skin: Denies redness, rashes, lesions or ulcercations.  Neurological: Denies dizziness, difficulty with memory, difficulty with speech or problems with balance and coordination.   Psych: Denies anxiety, depression, SI/HI.  No other specific complaints in a complete review of systems (except as listed in HPI above).  PE:  There were no vitals taken for this visit. Wt Readings from Last 3 Encounters:  01/09/20 170 lb (77.1 kg)  12/22/19 170 lb (77.1 kg)  12/15/19 170 lb (77.1 kg)    General: Appears their stated age, well developed, well nourished in NAD. HEENT: Head: normal shape and size; Eyes: sclera white, no icterus, conjunctiva pink, PERRLA and EOMs intact; Ears: Tm's gray and intact, normal light reflex;Throat/Mouth: Teeth present, mucosa pink and moist, no lesions or ulcerations noted.  Neck: Neck supple, trachea midline. No masses, lumps or thyromegaly present.  Cardiovascular: Normal rate and rhythm. S1,S2 noted.  No murmur, rubs or gallops noted. No JVD or BLE edema. No carotid bruits noted. Pulmonary/Chest: Normal effort and positive vesicular breath sounds. No respiratory distress. No wheezes, rales or ronchi noted.  Abdomen: Soft and nontender. Normal bowel sounds, no bruits noted. No distention or masses noted. Liver, spleen and kidneys non palpable. Musculoskeletal: Normal range of motion. Strength 5/5 BUE/BLE. No signs of joint swelling. No difficulty with gait.  Neurological: Alert and oriented. Cranial nerves II-XII grossly intact. Coordination normal.  Psychiatric: Mood and affect normal. Behavior is normal. Judgment and thought content normal.    BMET    Component Value Date/Time   NA 135 12/22/2019 0851   NA 139 02/22/2014 0657   K 3.7 12/22/2019 0851   K 3.6 02/22/2014 0657   CL 102 12/22/2019 0851   CL 107 02/22/2014 0657   CO2 26 12/22/2019 0851   CO2 26 (H) 02/22/2014 0657   GLUCOSE 120 (H) 12/22/2019 0851   GLUCOSE 95 02/22/2014 0657   BUN 9 12/22/2019 0851   BUN 10 02/22/2014 0657   CREATININE 0.61 12/22/2019 0851   CREATININE 0.66 02/22/2014 0657   CALCIUM 8.7 (L) 12/22/2019 0851   CALCIUM 8.5 (L) 02/22/2014 0657    GFRNONAA >60 12/22/2019 0851   GFRAA >60 12/22/2019 0851    Lipid Panel  No results found for: "CHOL", "TRIG", "HDL", "CHOLHDL", "VLDL", "LDLCALC"  CBC    Component Value Date/Time   WBC 10.3 12/22/2019 0851   RBC 3.82 (L) 12/22/2019 0851   HGB 12.3 12/22/2019 0851   HGB 14.2 02/22/2014 0657   HCT 35.6 (L) 12/22/2019 0851   HCT 42.5 02/22/2014 0657   PLT 243 12/22/2019 0851   PLT 210 02/22/2014 0657   MCV 93.2 12/22/2019 0851   MCV 91 02/22/2014 0657   MCH 32.2 12/22/2019 0851   MCHC 34.6 12/22/2019 0851   RDW 12.2 12/22/2019 0851   RDW 12.8 02/22/2014 0657   LYMPHSABS 1.3 02/22/2014 0657   MONOABS 0.9 02/22/2014 0657   EOSABS 0.1 02/22/2014 0657   BASOSABS 0.0 02/22/2014 0657  Hgb A1C No results found for: "HGBA1C"   Assessment and Plan:   RTC in 6 months for your annual exam Nicki Reaper, NP

## 2022-08-09 ENCOUNTER — Emergency Department
Admission: EM | Admit: 2022-08-09 | Discharge: 2022-08-09 | Disposition: A | Discharge status: Home / Self Care | Payer: Medicaid Other | Attending: Emergency Medicine | Admitting: Emergency Medicine

## 2022-08-09 ENCOUNTER — Other Ambulatory Visit: Payer: Self-pay

## 2022-08-09 ENCOUNTER — Emergency Department: Payer: Medicaid Other

## 2022-08-09 DIAGNOSIS — J101 Influenza due to other identified influenza virus with other respiratory manifestations: Secondary | ICD-10-CM

## 2022-08-09 DIAGNOSIS — J168 Pneumonia due to other specified infectious organisms: Secondary | ICD-10-CM | POA: Insufficient documentation

## 2022-08-09 DIAGNOSIS — R059 Cough, unspecified: Secondary | ICD-10-CM | POA: Diagnosis present

## 2022-08-09 DIAGNOSIS — J45909 Unspecified asthma, uncomplicated: Secondary | ICD-10-CM | POA: Diagnosis not present

## 2022-08-09 DIAGNOSIS — R000 Tachycardia, unspecified: Secondary | ICD-10-CM | POA: Insufficient documentation

## 2022-08-09 DIAGNOSIS — J189 Pneumonia, unspecified organism: Secondary | ICD-10-CM

## 2022-08-09 MED ORDER — PREDNISONE 20 MG PO TABS: 60.0000 mg | ORAL_TABLET | Freq: Every day | ORAL | 0 refills | Status: AC

## 2022-08-09 MED ORDER — ONDANSETRON 4 MG PO TBDP: 4.0000 mg | ORAL_TABLET | Freq: Three times a day (TID) | ORAL | 0 refills | Status: AC | PRN

## 2022-08-09 MED ORDER — ONDANSETRON 4 MG PO TBDP
4.0000 mg | ORAL_TABLET | Freq: Once | ORAL | Status: AC
  Administered 2022-08-09: 4 mg via ORAL
  Filled 2022-08-09: qty 1

## 2022-08-09 MED ORDER — DOXYCYCLINE HYCLATE 100 MG PO TABS
100.0000 mg | ORAL_TABLET | Freq: Once | ORAL | Status: AC
  Administered 2022-08-09: 100 mg via ORAL
  Filled 2022-08-09: qty 1

## 2022-08-09 MED ORDER — DOXYCYCLINE HYCLATE 100 MG PO CAPS: 100.0000 mg | ORAL_CAPSULE | Freq: Two times a day (BID) | ORAL | 0 refills | Status: AC

## 2022-08-09 MED ORDER — PREDNISONE 20 MG PO TABS
60.0000 mg | ORAL_TABLET | Freq: Once | ORAL | Status: AC
  Administered 2022-08-09: 60 mg via ORAL
  Filled 2022-08-09: qty 3

## 2022-08-09 MED ORDER — IPRATROPIUM-ALBUTEROL 0.5-2.5 (3) MG/3ML IN SOLN
3.0000 mL | Freq: Once | RESPIRATORY_TRACT | Status: AC
  Administered 2022-08-09: 3 mL via RESPIRATORY_TRACT
  Filled 2022-08-09: qty 3

## 2022-08-09 NOTE — ED Notes (Signed)
See triage note  Presents with cough and body aches  States she was dx'd with the flu last week  States she thinks she is worse  Low grade temp on  arrival   decreased appetite

## 2022-08-09 NOTE — ED Triage Notes (Signed)
First nurse note: Pt to ED via [facility] from home. Pt reports cough and phlegm x1 week. Pt dx with Flu last Thursday. Pt hx asthma. Pt ambulatory with EMS and to triage.   EMS VS: HR 129 96% RA BP 143/97

## 2022-08-09 NOTE — ED Provider Notes (Signed)
[facility] Provider Note    Event Date/Time   First MD Initiated Contact with Patient 08/09/22 [phone]     (approximate)   History   Chief Complaint Cough (Flu +)   HPI  Brandy Ward is a 26 y.o. female with past medical history of asthma and GERD who presents to the ED complaining of cough.  Patient reports that she has been feeling ill for about the past week, was diagnosed with influenza by her PCP last week.  Since then, she has been using her inhaler at home, but complains of increasing cough productive of yellowish and brown sputum.  She has continued to have fevers with weakness, malaise, and nausea.  She reports minimal relief from her inhaler.     Physical Exam   Triage Vital Signs: ED Triage Vitals [08/09/22 0746]  Enc Vitals Group     BP 112/71     Pulse Rate (!) 104     Resp 18     Temp 99.3 F (37.4 C)     Temp Source Oral     SpO2 99 %     Weight      Height      Head Circumference      Peak Flow      Pain Score      Pain Loc      Pain Edu?      Excl. in [place]?     Most recent vital signs: Vitals:   08/09/22 0746  BP: 112/71  Pulse: (!) 104  Resp: 18  Temp: 99.3 F (37.4 C)  SpO2: 99%    Constitutional: Alert and oriented. Eyes: Conjunctivae are normal. Head: Atraumatic. Nose: No congestion/rhinnorhea. Mouth/Throat: Mucous membranes are moist.  Cardiovascular: Normal rate, regular rhythm. Grossly normal heart sounds.  2+ radial pulses bilaterally. Respiratory: Normal respiratory effort.  No retractions. Lungs with end expiratory wheezing bilaterally. Gastrointestinal: Soft and nontender. No distention. Musculoskeletal: No lower extremity tenderness nor edema.  Neurologic:  Normal speech and language. No gross focal neurologic deficits are appreciated.    ED Results / Procedures / Treatments   Labs (all labs ordered are listed, but only abnormal results are displayed) Labs Reviewed - No data to  display  RADIOLOGY Chest x-ray reviewed and interpreted by me with bilateral bibasilar opacities, no edema or effusion noted.  PROCEDURES:  Critical Care performed: No  Procedures   MEDICATIONS ORDERED IN ED: Medications  ipratropium-albuterol (DUONEB) 0.5-2.5 (3) MG/3ML nebulizer solution 3 mL (3 mLs Nebulization Given 08/09/22 0954)  predniSONE (DELTASONE) tablet 60 mg (60 mg Oral Given 08/09/22 0953)  doxycycline (VIBRA-TABS) tablet 100 mg (100 mg Oral Given 08/09/22 0954)  ondansetron (ZOFRAN-ODT) disintegrating tablet 4 mg (4 mg Oral Given 08/09/22 0953)     IMPRESSION / MDM / ASSESSMENT AND PLAN / ED COURSE  I reviewed the triage vital signs and the nursing notes.                              26 y.o. female with past medical history of asthma and GERD who presents to the ED with worsening productive cough, malaise, and nausea following diagnosis of influenza last week.  Patient's presentation is most consistent with acute illness / injury with system symptoms.  Differential diagnosis includes, but is not limited to, influenza, COVID-19, pneumonia, dehydration, electrolyte abnormality, AKI, asthma exacerbation.  Patient nontoxic-appearing and in no acute distress, vital signs remarkable for  mild tachycardia but otherwise reassuring.  She has some mild expiratory wheezing on exam but is not in any respiratory distress and maintaining oxygen saturations at 99% on room air.  Chest x-ray is concerning for bibasilar opacities, patient may now be developing pneumonia which would explain her worsening symptoms 1 week after testing positive for the flu.  We will treat with doxycycline, also treat apparent asthma exacerbation with prednisone and DuoNeb.  Plan to treat symptomatically with ODT Zofran and reassess.  She is overall well-appearing and I doubt sepsis or significant dehydration.  Patient with resolution of wheezing following DuoNeb, reports feeling better and continues to  maintain oxygen saturations on room air with no increased work of breathing.  She is appropriate for outpatient management of pneumonia and mild asthma exacerbation, will prescribe doxycycline and prednisone in addition to her inhaler at home.  She was counseled to follow-up with PCP and to return to the ED for new or worsening symptoms.  Patient agrees with plan.      FINAL CLINICAL IMPRESSION(S) / ED DIAGNOSES   Final diagnoses:  Pneumonia of both lower lobes due to infectious organism  Influenza A     Rx / DC Orders   ED Discharge Orders          Ordered    doxycycline (VIBRAMYCIN) 100 MG capsule  2 times daily        08/09/22 1111    predniSONE (DELTASONE) 20 MG tablet  Daily with breakfast        08/09/22 1111    ondansetron (ZOFRAN-ODT) 4 MG disintegrating tablet  Every 8 hours PRN        08/09/22 1111             Note:  This document was prepared using Dragon voice recognition software and may include unintentional dictation errors.   Chesley Noon, MD 08/09/22 [phone]

## 2023-03-13 ENCOUNTER — Ambulatory Visit: Payer: Medicaid Other

## 2023-03-13 ENCOUNTER — Ambulatory Visit: Payer: Medicaid Other | Admitting: Family Medicine

## 2023-04-29 ENCOUNTER — Ambulatory Visit: Payer: Medicaid Other

## 2023-06-03 ENCOUNTER — Encounter: Payer: Self-pay | Admitting: Emergency Medicine

## 2023-06-03 ENCOUNTER — Other Ambulatory Visit: Payer: Self-pay

## 2023-06-03 ENCOUNTER — Emergency Department
Admission: EM | Admit: 2023-06-03 | Discharge: 2023-06-03 | Disposition: A | Discharge status: Home / Self Care | Payer: Medicaid Other | Attending: Emergency Medicine | Admitting: Emergency Medicine

## 2023-06-03 DIAGNOSIS — N751 Abscess of Bartholin's gland: Secondary | ICD-10-CM | POA: Insufficient documentation

## 2023-06-03 DIAGNOSIS — R102 Pelvic and perineal pain: Secondary | ICD-10-CM | POA: Diagnosis present

## 2023-06-03 DIAGNOSIS — N760 Acute vaginitis: Secondary | ICD-10-CM | POA: Insufficient documentation

## 2023-06-03 LAB — URINALYSIS, W/ REFLEX TO CULTURE (INFECTION SUSPECTED)
Bilirubin Urine: NEGATIVE
Glucose, UA: NEGATIVE mg/dL
Ketones, ur: NEGATIVE mg/dL
Leukocytes,Ua: NEGATIVE
Nitrite: NEGATIVE
Protein, ur: NEGATIVE mg/dL
RBC / HPF: >50 RBC/hpf (ref 0–5)
Specific Gravity, Urine: 1.015 (ref 1.005–1.030)
pH: 6 (ref 5.0–8.0)

## 2023-06-03 LAB — WET PREP, GENITAL
Clue Cells Wet Prep HPF POC: NONE SEEN
Sperm: NONE SEEN
Trich, Wet Prep: NONE SEEN
WBC, Wet Prep HPF POC: <10 (ref <10)
Yeast Wet Prep HPF POC: NONE SEEN

## 2023-06-03 LAB — POC URINE PREG, ED: Preg Test, Ur: NEGATIVE

## 2023-06-03 MED ORDER — OXYCODONE-ACETAMINOPHEN 5-325 MG PO TABS: 1.0000 | ORAL_TABLET | ORAL | 0 refills | Status: AC | PRN

## 2023-06-03 MED ORDER — LIDOCAINE HCL (PF) 1 % IJ SOLN
5.0000 mL | Freq: Once | INTRAMUSCULAR | Status: AC
  Administered 2023-06-03: 5 mL
  Filled 2023-06-03: qty 5

## 2023-06-03 MED ORDER — OXYCODONE-ACETAMINOPHEN 5-325 MG PO TABS
1.0000 | ORAL_TABLET | Freq: Once | ORAL | Status: AC
  Administered 2023-06-03: 1 via ORAL
  Filled 2023-06-03: qty 1

## 2023-06-03 MED ORDER — SULFAMETHOXAZOLE-TRIMETHOPRIM 800-160 MG PO TABS: 1.0000 | ORAL_TABLET | Freq: Two times a day (BID) | ORAL | 0 refills | Status: AC

## 2023-06-03 MED ORDER — ONDANSETRON 4 MG PO TBDP
4.0000 mg | ORAL_TABLET | Freq: Once | ORAL | Status: AC
  Administered 2023-06-03: 4 mg via ORAL
  Filled 2023-06-03: qty 1

## 2023-06-03 MED ORDER — LIDOCAINE-PRILOCAINE 2.5-2.5 % EX CREA
TOPICAL_CREAM | Freq: Once | CUTANEOUS | Status: AC
  Filled 2023-06-03: qty 5

## 2023-06-03 MED ORDER — AMOXICILLIN-POT CLAVULANATE 875-125 MG PO TABS: 1.0000 | ORAL_TABLET | Freq: Two times a day (BID) | ORAL | 0 refills | Status: AC

## 2023-06-03 NOTE — ED Notes (Signed)
See triage notes. Patient c/o cyst to her vaginal area times four days. Patient has hx of bartholin cyst.

## 2023-06-03 NOTE — ED Triage Notes (Signed)
EMS brings pt in from home for reports of vaginal abscess x 4 days; st hx of same and rx clindamycin via tele-health visit without relief

## 2023-06-03 NOTE — ED Provider Notes (Addendum)
[facility] Provider Note    Event Date/Time   First MD Initiated Contact with Patient 06/03/23 [phone]     (approximate)   History   Abscess   HPI  Brandy Ward is a 27 y.o. female presents to the emergency department with pain to her vaginal area.  States that she started having pain in a vaginal abscess over the past 4 days.  Endorses chills but no episodes of fever.  States that she has severe pain to her left labia.  History of birth 1 glands cyst in the past but states is never been this painful.  Denies nausea or vomiting.  Denies abdominal pain.  Endorses recurrent episodes of bacterial vaginosis.  On arrival patient immediately stating that she needs general anesthesia in order to have this drained.  States that she passes out with pain.  States that she needs anesthesia and that she will not be getting stuck without anesthesia.  On FaceTime with her sister who is cursing that she needs anesthesia over the telephone and that she received general anesthesia for an axilla abscess in the past.     Physical Exam   Triage Vital Signs: ED Triage Vitals [06/03/23 0637]  Encounter Vitals Group     BP      Systolic BP Percentile      Diastolic BP Percentile      Pulse      Resp      Temp      Temp src      SpO2      Weight 190 lb (86.2 kg)     Height 5\' 7"  (1.702 m)     Head Circumference      Peak Flow      Pain Score 10     Pain Loc      Pain Education      Exclude from Growth Chart     Most recent vital signs: There were no vitals filed for this visit.  Physical Exam Exam conducted with a chaperone present.  Constitutional:      Appearance: She is well-developed.  HENT:     Head: Atraumatic.  Eyes:     Conjunctiva/sclera: Conjunctivae normal.  Cardiovascular:     Rate and Rhythm: Regular rhythm.  Pulmonary:     Effort: No respiratory distress.  Abdominal:     General: There is no distension.  Genitourinary:    Labia:         Left: Tenderness and lesion present.      Comments: Fluctuance to the left labia at the 4 o'clock position with induration.  No crepitance.  Tenderness to palpation. Musculoskeletal:        General: Normal range of motion.     Cervical back: Normal range of motion.  Skin:    General: Skin is warm.  Neurological:     Mental Status: She is alert. Mental status is at baseline.     IMPRESSION / MDM / ASSESSMENT AND PLAN / ED COURSE  I reviewed the triage vital signs and the nursing notes.  Differential diagnosis including Bartholin's gland cyst, Bartholin's gland abscess, cellulitis.  Clinical picture is not consistent with a necrotizing soft tissue infection.  Clinical picture is not consistent with Fournier's.   LABS (all labs ordered are listed, but only abnormal results are displayed) Labs interpreted as -    Labs Reviewed  URINALYSIS, W/ REFLEX TO CULTURE (INFECTION SUSPECTED) - Abnormal; Notable for the following components:  Result Value   Color, Urine YELLOW (*)    APPearance HAZY (*)    Hgb urine dipstick LARGE (*)    Bacteria, UA FEW (*)    All other components within normal limits  WET PREP, GENITAL  CHLAMYDIA/NGC RT PCR ([facility] ONLY)            HCG, QUANTITATIVE, PREGNANCY  POC URINE PREG, ED     MDM   Discussed at length that we do not do give general anesthesia for Bartholin gland abscess drainage and that we used topical medications.  Offered Emla cream, Percocet and lidocaine injection.  Asked multiple times for the patient to please either mute or get off of the telephone given the ongoing yelling and expletives from family member over the telephone.  Patient did comply of muting the telephone during pelvic exam.  Called charge nurse and discussed with Brandy Ward, patient advocate is not currently here but offered number to the patient.  Emla applied.  Waiting for mother to arrive in order to proceed with incision and drainage.  Discussed alternative of starting  antibiotics and talking with gynecology for further management, discussed they would likely do a bedside incision and drainage and that this is not something that belief goes to the OR for drainage.  Patient expressed understanding and stated that she would like to proceed once her mom is here with incision and drainage and treatment of breath lung glands abscess.  10:58 AM Mother arrived -speculum exam performed, following patient had syncopal episode/nonepileptic seizure that lasted for approximately 30 seconds and then resolved.  Immediately back to her normal state.  Given time and will repeat attempt for incision and drainage of Bartholin abscess.  10:58 AM Mother had to leave in order to go to a cardiology appointment.  Patient refusing I&D without her mother.    10:58 AM Incision and drainage performed at bedside with no complications.  Word catheter placed.  Discussed ibuprofen and Tylenol for pain control.  Patient and the mother became very upset, screaming and cursing in the room.  Yelled that that does not work for her, and that they would call another provider to get pain control.  Discussed that oxycodone has been shown to be just as effective as Motrin and Tylenol and concern that I would not normally call and narcotic pain make medications for this procedure.  After long shared decision making with the patient.  Will call in a short course of oxycodone for pain control.  Patient advocate went in and talked with the patient and her mother at bedside.      PROCEDURES:  Critical Care performed: No  ..Incision and Drainage  Date/Time: 06/03/2023 10:57 AM  Performed by: Corena Herter, MD Authorized by: Corena Herter, MD   Consent:    Consent obtained:  Verbal   Consent given by:  Patient   Risks, benefits, and alternatives were discussed: yes     Risks discussed:  Bleeding, incomplete drainage, pain and infection   Alternatives discussed:  No treatment and delayed  treatment Universal protocol:    Procedure explained and questions answered to patient or proxy's satisfaction: yes     Relevant documents present and verified: yes     Test results available : yes     Required blood products, implants, devices, and special equipment available: yes     Patient identity confirmed:  Verbally with patient, arm band and provided demographic data Location:    Type:  Bartholin cyst   Size:  3 x 3 cm   Location:  Anogenital   Anogenital location:  Bartholin's gland Sedation:    Sedation type:  None Anesthesia:    Anesthesia method:  Local infiltration (emla)   Local anesthetic:  Lidocaine 1% w/o epi Procedure type:    Complexity:  Complex Procedure details:    Ultrasound guidance: no     Needle aspiration: no     Drainage:  Purulent and bloody   Drainage amount:  Moderate   Wound treatment:  Drain placed   Packing materials:  Word catheter Post-procedure details:    Procedure completion:  Tolerated well, no immediate complications   Patient's presentation is most consistent with acute presentation with potential threat to life or bodily function.   MEDICATIONS ORDERED IN ED: Medications  lidocaine-prilocaine (EMLA) cream ( Topical Given by Other 06/03/23 0745)  oxyCODONE-acetaminophen (PERCOCET/ROXICET) 5-325 MG per tablet 1 tablet (1 tablet Oral Given 06/03/23 0750)  lidocaine (PF) (XYLOCAINE) 1 % injection 5 mL (5 mLs Other Given by Other 06/03/23 0811)  ondansetron (ZOFRAN-ODT) disintegrating tablet 4 mg (4 mg Oral Given 06/03/23 0816)    FINAL CLINICAL IMPRESSION(S) / ED DIAGNOSES   Final diagnoses:  Bartholin's gland abscess     Rx / DC Orders   ED Discharge Orders          Ordered    amoxicillin-clavulanate (AUGMENTIN) 875-125 MG tablet  2 times daily        06/03/23 1036    sulfamethoxazole-trimethoprim (BACTRIM DS) 800-160 MG tablet  2 times daily        06/03/23 1036    oxyCODONE-acetaminophen (PERCOCET) 5-325 MG tablet   Every 4 hours PRN        06/03/23 1044             Note:  This document was prepared using Dragon voice recognition software and may include unintentional dictation errors.   Corena Herter, MD 06/03/23 1043    Corena Herter, MD 06/03/23 1058

## 2023-06-03 NOTE — Discharge Instructions (Addendum)
You were seen in the emergency department for a Bartholin glands abscess.  It was drained in the emergency department and placed a catheter.  Use sitz bath's for next 2 days, no vaginal intercourse.  Call your gynecologist today to schedule close follow-up appointment for reevaluation, they will need to remove the Word catheter or he can return to the emergency department.  You are given a prescription for antibiotics.  You can alternate Motrin and Tylenol for pain control.  Pain control:  Ibuprofen (motrin/aleve/advil) - You can take 3 tablets (600 mg) every 6 hours as needed for pain/fever.  Acetaminophen (tylenol) - You can take 2 extra strength tablets (1000 mg) every 6 hours as needed for pain/fever.  You can alternate these medications or take them together.  Make sure you eat food/drink water when taking these medications.  You were given a prescription for narcotic pain medications.  Take only if in severe pain.  These are very addictive medications.  These medications can make you constipated.  If you need to take more than 1-2 doses, start a stool softner.  If you become constipated, take 1 capfull of MiraLAX, can repeat untill having regular bowel movements.  Keep this medication out of reach of any children.   Corena Herter, MD

## 2023-08-14 NOTE — Progress Notes (Deleted)
New patient visit  Patient: Brandy Ward   DOB: 05-01-1996   27 y.o. Female  MRN: [id number] Visit Date: 08/21/2023  Today's healthcare provider: Debera Lat, PA-C   No chief complaint on file.  Subjective    Brandy Ward is a 27 y.o. female who presents today as a new patient to establish care.  HPI  *** Discussed the use of AI scribe software for clinical note transcription with the patient, who gave verbal consent to proceed.  History of Present Illness            Past Medical History:  Diagnosis Date   Asthma    Frequent headaches    migraine   GERD (gastroesophageal reflux disease)    Herpes    PONV (postoperative nausea and vomiting)    Past Surgical History:  Procedure Laterality Date   CESAREAN SECTION N/A 10/05/2017   Procedure: CESAREAN SECTION;  Surgeon: Christeen Douglas, MD;  Location: [facility] ORS;  Service: Obstetrics;  Laterality: N/A;   EYE SURGERY     WISDOM TOOTH EXTRACTION     Family Status  Relation Name Status   Mother  Alive   Father  Alive   MGM  Deceased   Sister  (Not Specified)   Mat Aunt  (Not Specified)   PGF  Deceased  No partnership data on file   Family History  Problem Relation Age of Onset   Hypertension Mother    Asthma Mother    Anxiety disorder Mother    Depression Mother    Fibroids Mother    Diabetes Maternal Grandmother    Asthma Maternal Grandmother    Cervical cancer Maternal Grandmother    Hypertension Maternal Grandmother    Asthma Sister    Diabetes Maternal Aunt    Brain cancer Paternal Grandfather    Lung cancer Paternal Grandfather    Social History   Socioeconomic History   Marital status: Single    Spouse name: Not on file   Number of children: Not on file   Years of education: Not on file   Highest education level: Not on file  Occupational History   Not on file  Tobacco Use   Smoking status: Never   Smokeless tobacco: Never  Substance and Sexual Activity   Alcohol use: No   Drug use: No    Sexual activity: Yes    Birth control/protection: Implant  Other Topics Concern   Not on file  Social History Narrative   Not on file   Social Drivers of Health   Financial Resource Strain: Not on file  Food Insecurity: No Food Insecurity (06/26/2023)   Received from [facility]   Hunger Vital Sign    Worried About Running Out of Food in the Last Year: Never true    Ran Out of Food in the Last Year: Never true  Transportation Needs: No Transportation Needs (06/26/2023)   Received from [facility] - Transportation    In the past 12 months, has lack of transportation kept you from medical appointments or from getting medications?: No    Lack of Transportation (Non-Medical): No  Physical Activity: Not on file  Stress: Not on file  Social Connections: Not on file   Outpatient Medications Prior to Visit  Medication Sig   albuterol (PROVENTIL HFA;VENTOLIN HFA) 108 (90 Base) MCG/ACT inhaler Inhale 2 puffs into the lungs every 6 (six) hours as needed for wheezing or shortness of breath.   cetirizine (  ZYRTEC) 10 MG tablet Take 10 mg by mouth daily.   ferrous sulfate 325 (65 FE) MG tablet Take 1 tablet (325 mg total) by mouth daily with breakfast. Take with Vitamin C   ibuprofen (ADVIL,MOTRIN) 800 MG tablet Take 1 tablet (800 mg total) by mouth every 8 (eight) hours as needed for moderate pain or cramping.   lidocaine (XYLOCAINE) 2 % solution Use as directed 15 mLs in the mouth or throat as needed for mouth pain.   omeprazole (PRILOSEC) 40 MG capsule Take 40 mg by mouth daily.   ondansetron (ZOFRAN-ODT) 4 MG disintegrating tablet Take 1 tablet (4 mg total) by mouth every 8 (eight) hours as needed for nausea or vomiting.   No facility-administered medications prior to visit.   Allergies  Allergen Reactions   Latex Dermatitis    condoms    Immunization History  Administered Date(s) Administered   PPD Test 03/19/2019    Health Maintenance   Topic Date Due   Hepatitis C Screening  Never done   DTaP/Tdap/Td (1 - Tdap) Never done   Cervical Cancer Screening (Pap smear)  04/14/2020   INFLUENZA VACCINE  Never done   COVID-19 Vaccine (1 - 2024-25 season) Never done   HIV Screening  Completed   HPV VACCINES  Aged Out    Patient Care Team: Pcp, No as PCP - General  Review of Systems Except see HPI   {Insert previous labs (optional):23779} {See past labs  Heme  Chem  Endocrine  Serology  Results Review (optional):1}   Objective    There were no vitals taken for this visit. {Insert last BP/Wt (optional):23777}{See vitals history (optional):1}   Physical Exam  Depression Screen     No data to display         No results found for any visits on 08/21/23.  Assessment & Plan     *** Assessment and Plan              Encounter to establish care Welcomed to our clinic Reviewed past medical hx, social hx, family hx and surgical hx Pt advised to send all vaccination records or screening   No follow-ups on file.    The patient was advised to call back or seek an in-person evaluation if the symptoms worsen or if the condition fails to improve as anticipated.  I discussed the assessment and treatment plan with the patient. The patient was provided an opportunity to ask questions and all were answered. The patient agreed with the plan and demonstrated an understanding of the instructions.  I, Debera Lat, PA-C have reviewed all documentation for this visit. The documentation on  08/21/2023   for the exam, diagnosis, procedures, and orders are all accurate and complete.  Debera Lat, [facility], MMS [facility] [phone] (phone) [phone] (fax)  [facility]

## 2023-08-21 ENCOUNTER — Ambulatory Visit: Payer: Medicaid Other | Admitting: Physician Assistant

## 2023-08-21 DIAGNOSIS — F819 Developmental disorder of scholastic skills, unspecified: Secondary | ICD-10-CM

## 2023-08-21 DIAGNOSIS — Z0000 Encounter for general adult medical examination without abnormal findings: Secondary | ICD-10-CM

## 2023-09-18 ENCOUNTER — Ambulatory Visit: Payer: Medicaid Other | Admitting: Physician Assistant

## 2023-12-04 ENCOUNTER — Emergency Department

## 2023-12-04 ENCOUNTER — Emergency Department
Admission: EM | Admit: 2023-12-04 | Discharge: 2023-12-04 | Disposition: A | Discharge status: Home / Self Care | Attending: Emergency Medicine | Admitting: Emergency Medicine

## 2023-12-04 ENCOUNTER — Encounter: Payer: Self-pay | Admitting: Emergency Medicine

## 2023-12-04 ENCOUNTER — Other Ambulatory Visit: Payer: Self-pay

## 2023-12-04 DIAGNOSIS — J069 Acute upper respiratory infection, unspecified: Secondary | ICD-10-CM | POA: Insufficient documentation

## 2023-12-04 DIAGNOSIS — J45901 Unspecified asthma with (acute) exacerbation: Secondary | ICD-10-CM | POA: Diagnosis not present

## 2023-12-04 DIAGNOSIS — R059 Cough, unspecified: Secondary | ICD-10-CM | POA: Diagnosis present

## 2023-12-04 LAB — RESP PANEL BY RT-PCR (RSV, FLU A&B, COVID)  RVPGX2
Influenza A by PCR: NEGATIVE
Influenza B by PCR: NEGATIVE
Resp Syncytial Virus by PCR: NEGATIVE
SARS Coronavirus 2 by RT PCR: NEGATIVE

## 2023-12-04 MED ORDER — ALBUTEROL SULFATE HFA 108 (90 BASE) MCG/ACT IN AERS: 2.0000 | INHALATION_SPRAY | Freq: Four times a day (QID) | RESPIRATORY_TRACT | 2 refills | Status: AC | PRN

## 2023-12-04 MED ORDER — IBUPROFEN 600 MG PO TABS
600.0000 mg | ORAL_TABLET | Freq: Once | ORAL | Status: AC
  Administered 2023-12-04: 600 mg via ORAL
  Filled 2023-12-04: qty 1

## 2023-12-04 MED ORDER — IPRATROPIUM-ALBUTEROL 0.5-2.5 (3) MG/3ML IN SOLN
3.0000 mL | Freq: Once | RESPIRATORY_TRACT | Status: AC
  Administered 2023-12-04: 3 mL via RESPIRATORY_TRACT
  Filled 2023-12-04: qty 3

## 2023-12-04 MED ORDER — PREDNISONE 20 MG PO TABS: 40.0000 mg | ORAL_TABLET | Freq: Every day | ORAL | 0 refills | Status: AC

## 2023-12-04 NOTE — ED Provider Notes (Signed)
 [facility] Provider Note    Event Date/Time   First MD Initiated Contact with Patient 12/04/23 1244     (approximate)   History   Cough   HPI  Brandy Ward is a 28 year old female presenting to the emergency department for evaluation for evaluation of cough and congestion.  Symptoms ongoing for 3 days.  Has been taking over-the-counter medicine with limited benefit.  Son recently sick with similar symptoms.  Reports objective fevers.  Does have a history of asthma for which she does not typically require medication, does typically use a rescue inhaler when sick, but does not currently have access to one.      Physical Exam   Triage Vital Signs: ED Triage Vitals  Encounter Vitals Group     BP 12/04/23 1216 (!) 140/84     Systolic BP Percentile --      Diastolic BP Percentile --      Pulse Rate 12/04/23 1216 (!) 120     Resp 12/04/23 1216 20     Temp 12/04/23 1216 (!) 97.5 F (36.4 C)     Temp Source 12/04/23 1216 Oral     SpO2 12/04/23 1216 98 %     Weight 12/04/23 1220 190 lb (86.2 kg)     Height 12/04/23 1220 5\' 7"  (1.702 m)     Head Circumference --      Peak Flow --      Pain Score 12/04/23 1220 3     Pain Loc --      Pain Education --      Exclude from Growth Chart --     Most recent vital signs: Vitals:   12/04/23 1415 12/04/23 1430  BP:    Pulse: 94 88  Resp:    Temp:    SpO2:       General: Awake, interactive  HEENT: TMs clear bilaterally.  Erythema the posterior oropharynx without tonsillar swelling or exudates.  Uvula midline.  No swelling underneath the tongue. CV:  Regular rate, good peripheral perfusion.  Resp:  Unlabored respirations, wheezing at the bilateral posterior bases, lungs otherwise clear Abd:  Nondistended, soft, nontender Neuro:  Symmetric facial movement, fluid speech   ED Results / Procedures / Treatments   Labs (all labs ordered are listed, but only abnormal results are displayed) Labs Reviewed   RESP PANEL BY RT-PCR (RSV, FLU A&B, COVID)  [id number]     EKG EKG independently reviewed interpreted by myself (ER attending) demonstrates:    RADIOLOGY Imaging independently reviewed and interpreted by myself demonstrates: CXR without focal consolidation   Formal Radiology Read:  DG Chest 2 View Result Date: 12/04/2023 CLINICAL DATA:  Productive cough for 3 days EXAM: CHEST - 2 VIEW COMPARISON:  08/09/2022 FINDINGS: Frontal and lateral views of the chest demonstrate an unremarkable cardiac silhouette. No acute airspace disease, effusion, or pneumothorax. There are no acute bony abnormalities. IMPRESSION: 1. No acute intrathoracic process. Electronically Signed   By: Sharlet Salina M.D.   On: 12/04/2023 14:43    PROCEDURES:  Critical Care performed: No  Procedures   MEDICATIONS ORDERED IN ED: Medications  ipratropium-albuterol (DUONEB) 0.5-2.5 (3) MG/3ML nebulizer solution 3 mL (3 mLs Nebulization Given 12/04/23 1319)  ibuprofen (ADVIL) tablet 600 mg (600 mg Oral Given 12/04/23 1447)     IMPRESSION / MDM / ASSESSMENT AND PLAN / ED COURSE  I reviewed the triage vital signs and the nursing notes.  Differential diagnosis includes, but is not limited to,  viral illness, pneumonia, asthma flare, no evidence of otitis media, strep pharyngitis on exam  Patient's presentation is most consistent with acute complicated illness / injury requiring diagnostic workup.  28 year old female presenting with upper respiratory symptoms.  Tachycardic on presentation, improved at the time of my initial evaluation.  Viral swab negative.  X-Orly Quimby with no evidence of pneumonia on my review.  Suspect likely viral trigger of asthma flare.  Patient reassessed after DuoNeb treatment with improvement in air movement and wheezing.  She is comfortable with discharge home.  Will DC with prescription for albuterol inhaler and steroids.     FINAL CLINICAL IMPRESSION(S) / ED DIAGNOSES   Final diagnoses:  Upper  respiratory tract infection, unspecified type  Exacerbation of asthma, unspecified asthma severity, unspecified whether persistent     Rx / DC Orders   ED Discharge Orders          Ordered    albuterol (VENTOLIN HFA) 108 (90 Base) MCG/ACT inhaler  Every 6 hours PRN        12/04/23 1449    predniSONE (DELTASONE) 20 MG tablet  Daily with breakfast        12/04/23 1449             Note:  This document was prepared using Dragon voice recognition software and may include unintentional dictation errors.   Claria Crofts, MD 12/04/23 [phone]

## 2023-12-04 NOTE — ED Notes (Signed)
 Pt reports of a productive cough and sore throat that started three days ago. Pt states that she has also had headaches that have been unrelieved with Tylenol and Ibuprofen.

## 2023-12-04 NOTE — Discharge Instructions (Addendum)
 You were seen in the ER for your cough and congestion.  I suspect you likely have a viral illness that has flared your asthma.  You can continue to use over-the-counter medicine to help with your symptoms.  I have also sent a prescription for an albuterol inhaler as well as a short steroid course to your pharmacy.  Follow-up with a primary care doctor for further evaluation.  Return to the ER for new or worsening symptoms.

## 2023-12-04 NOTE — ED Triage Notes (Signed)
 Pt c/o cough x3 days with Aneeka Bowden sputum. Pt has been taking robitussin and mucinex. Pt reports her son has recently been sick with similar sx.

## 2024-05-31 ENCOUNTER — Encounter: Payer: Self-pay | Admitting: Emergency Medicine

## 2024-05-31 ENCOUNTER — Emergency Department
Admission: EM | Admit: 2024-05-31 | Discharge: 2024-05-31 | Disposition: A | Discharge status: Home / Self Care | Attending: Emergency Medicine | Admitting: Emergency Medicine

## 2024-05-31 ENCOUNTER — Other Ambulatory Visit: Payer: Self-pay

## 2024-05-31 ENCOUNTER — Emergency Department

## 2024-05-31 DIAGNOSIS — O99511 Diseases of the respiratory system complicating pregnancy, first trimester: Secondary | ICD-10-CM | POA: Insufficient documentation

## 2024-05-31 DIAGNOSIS — J45909 Unspecified asthma, uncomplicated: Secondary | ICD-10-CM | POA: Insufficient documentation

## 2024-05-31 DIAGNOSIS — R1030 Lower abdominal pain, unspecified: Secondary | ICD-10-CM | POA: Diagnosis not present

## 2024-05-31 DIAGNOSIS — Z3A01 Less than 8 weeks gestation of pregnancy: Secondary | ICD-10-CM | POA: Insufficient documentation

## 2024-05-31 DIAGNOSIS — Z3491 Encounter for supervision of normal pregnancy, unspecified, first trimester: Secondary | ICD-10-CM

## 2024-05-31 DIAGNOSIS — M545 Low back pain, unspecified: Secondary | ICD-10-CM | POA: Insufficient documentation

## 2024-05-31 DIAGNOSIS — O99891 Other specified diseases and conditions complicating pregnancy: Secondary | ICD-10-CM | POA: Diagnosis not present

## 2024-05-31 DIAGNOSIS — O26891 Other specified pregnancy related conditions, first trimester: Secondary | ICD-10-CM | POA: Diagnosis present

## 2024-05-31 LAB — COMPREHENSIVE METABOLIC PANEL WITH GFR
ALT: 12 U/L (ref 0–44)
AST: 18 U/L (ref 15–41)
Albumin: 3.8 g/dL (ref 3.5–5.0)
Alkaline Phosphatase: 60 U/L (ref 38–126)
Anion gap: 13 (ref 5–15)
BUN: 10 mg/dL (ref 6–20)
CO2: 25 mmol/L (ref 22–32)
Calcium: 9.1 mg/dL (ref 8.9–10.3)
Chloride: 103 mmol/L (ref 98–111)
Creatinine, Ser: 0.49 mg/dL (ref 0.44–1.00)
GFR, Estimated: >60 mL/min (ref >60)
Glucose, Bld: 78 mg/dL (ref 70–99)
Potassium: 3.7 mmol/L (ref 3.5–5.1)
Sodium: 141 mmol/L (ref 135–145)
Total Bilirubin: 0.6 mg/dL (ref 0.0–1.2)
Total Protein: 7.1 g/dL (ref 6.5–8.1)

## 2024-05-31 LAB — CBC
HCT: 38.2 % (ref 36.0–46.0)
Hemoglobin: 13 g/dL (ref 12.0–15.0)
MCH: 30.7 pg (ref 26.0–34.0)
MCHC: 34 g/dL (ref 30.0–36.0)
MCV: 90.3 fL (ref 80.0–100.0)
Platelets: 242 K/uL (ref 150–400)
RBC: 4.23 MIL/uL (ref 3.87–5.11)
RDW: 11.8 % (ref 11.5–15.5)
WBC: 8 K/uL (ref 4.0–10.5)
nRBC: 0 % (ref 0.0–0.2)

## 2024-05-31 LAB — URINALYSIS, ROUTINE W REFLEX MICROSCOPIC
Bilirubin Urine: NEGATIVE
Glucose, UA: NEGATIVE mg/dL
Ketones, ur: NEGATIVE mg/dL
Leukocytes,Ua: NEGATIVE
Nitrite: NEGATIVE
Protein, ur: NEGATIVE mg/dL
Specific Gravity, Urine: 1.014 (ref 1.005–1.030)
pH: 7 (ref 5.0–8.0)

## 2024-05-31 LAB — LIPASE, BLOOD: Lipase: 33 U/L (ref 11–51)

## 2024-05-31 LAB — HCG, QUANTITATIVE, PREGNANCY: hCG, Beta Chain, Quant, S: 12951 m[IU]/mL — ABNORMAL HIGH (ref <5)

## 2024-05-31 LAB — POC URINE PREG, ED: Preg Test, Ur: POSITIVE — AB

## 2024-05-31 MED ORDER — METOCLOPRAMIDE HCL 10 MG PO TABS: 10.0000 mg | ORAL_TABLET | Freq: Three times a day (TID) | ORAL | 0 refills | Status: AC

## 2024-05-31 NOTE — ED Provider Notes (Signed)
 [facility] Provider Note    Event Date/Time   First MD Initiated Contact with Patient 05/31/24 [phone]     (approximate)   History   Abdominal Pain   HPI  Brandy Ward is a 28 y.o. female with history of asthma, GERD and as listed in EMR presents to the emergency department for treatment and evaluation of lower abdominal pressure and low back pain for the past 3 days.  Home pregnancy test is positive.  Last menstrual cycle was 04/19/2024.  Gravida 2 para 1.  No vaginal bleeding or discharge.   Physical Exam    Vitals:   05/31/24 1226 05/31/24 1226  BP: 119/74 119/74  Pulse:  80  Resp:  18  Temp:    SpO2:  100%    General: Awake, no distress.  CV:  Good peripheral perfusion.  Resp:  Normal effort.  Abd:  No distention. Soft, nontender. Other:     ED Results / Procedures / Treatments   Labs (all labs ordered are listed, but only abnormal results are displayed)  Labs Reviewed  URINALYSIS, ROUTINE W REFLEX MICROSCOPIC - Abnormal; Notable for the following components:      Result Value   Color, Urine YELLOW (*)    APPearance CLOUDY (*)    Hgb urine dipstick SMALL (*)    Bacteria, UA RARE (*)    All other components within normal limits  HCG, QUANTITATIVE, PREGNANCY - Abnormal; Notable for the following components:   hCG, Beta Chain, Quant, S 12,951 (*)    All other components within normal limits  POC URINE PREG, ED - Abnormal; Notable for the following components:   Preg Test, Ur POSITIVE (*)    All other components within normal limits  LIPASE, BLOOD  COMPREHENSIVE METABOLIC PANEL WITH GFR  CBC     EKG  Not indicated.   RADIOLOGY  Image and radiology report reviewed and interpreted by me. Radiology report consistent with the same.  OB less than 14 weeks shows a single IUP measuring 5 weeks 5 days without complicating features.  PROCEDURES:  Critical Care performed: No  Procedures   MEDICATIONS ORDERED IN  ED:  Medications - No data to display   IMPRESSION / MDM / ASSESSMENT AND PLAN / ED COURSE   I have reviewed the triage note and vital signs. Vital signs stable.   Differential diagnosis includes, but is not limited to, abdominal pain in pregnancy, ectopic pregnancy, ovarian cyst, failed pregnancy.  Patient's presentation is most consistent with acute presentation with potential threat to life or bodily function.  28 year old female presenting to the emergency department for treatment and evaluation of lower abdominal pressure and lower back pain for the last few days.  Pregnancy test is positive.  No vaginal bleeding or discharge.  She is currently being treated for bacterial vaginosis.  Upcoming prenatal visit at [facility] on 06/17/2024.  CBC  is normal.  CMP is normal.  Beta-hCG is 12,951.  Urinalysis is contaminated with squamous epithelial cells and shows rare bacteria and small amount of hemoglobin however patient is asymptomatic.   Clinical Course as of 05/31/24 1438  Mon May 31, 2024  1224 Patient returned from US  and needs to leave. She was advised to log into MyChart for the results and return to the ER for any concerns if unable to see GYN.  [CT]    Clinical Course User Index [CT] Lorrene Graef B, FNP   US  shows single IUP measuring 5 weeks  and 5 days without complicating factors.  FINAL CLINICAL IMPRESSION(S) / ED DIAGNOSES   Final diagnoses:  First trimester pregnancy     Rx / DC Orders   ED Discharge Orders          Ordered    metoCLOPramide  (REGLAN ) 10 MG tablet  3 times daily with meals        05/31/24 1222             Note:  This document was prepared using Dragon voice recognition software and may include unintentional dictation errors.   Herlinda Kirk NOVAK, FNP 05/31/24 1439    Suzanne Kirsch, MD 06/01/24 [phone]

## 2024-05-31 NOTE — Discharge Instructions (Addendum)
 Check your MyChart for the official radiology report from your ultrasound.  Take Colace for constipation if needed.  Return to the emergency department or see gynecology for concerns.

## 2024-05-31 NOTE — ED Triage Notes (Signed)
 Pt to ED via POV c/o lower abdominal pressure and lower back pain for the past few days. Pt states that she had a positive home pregnancy test on 10/. Pts LMP was 04/19/24. Pt denies vaginal bleeding. G2P1.

## 2024-07-03 ENCOUNTER — Emergency Department

## 2024-07-03 ENCOUNTER — Emergency Department
Admission: EM | Admit: 2024-07-03 | Discharge: 2024-07-03 | Disposition: A | Discharge status: Home / Self Care | Attending: Emergency Medicine | Admitting: Emergency Medicine

## 2024-07-03 ENCOUNTER — Other Ambulatory Visit: Payer: Self-pay

## 2024-07-03 ENCOUNTER — Encounter: Payer: Self-pay | Admitting: Intensive Care

## 2024-07-03 DIAGNOSIS — R8271 Bacteriuria: Secondary | ICD-10-CM | POA: Insufficient documentation

## 2024-07-03 DIAGNOSIS — R1030 Lower abdominal pain, unspecified: Secondary | ICD-10-CM | POA: Diagnosis not present

## 2024-07-03 DIAGNOSIS — Z3A10 10 weeks gestation of pregnancy: Secondary | ICD-10-CM | POA: Diagnosis not present

## 2024-07-03 DIAGNOSIS — O26891 Other specified pregnancy related conditions, first trimester: Secondary | ICD-10-CM | POA: Insufficient documentation

## 2024-07-03 DIAGNOSIS — J45909 Unspecified asthma, uncomplicated: Secondary | ICD-10-CM | POA: Diagnosis not present

## 2024-07-03 LAB — CBC WITH DIFFERENTIAL/PLATELET
Abs Immature Granulocytes: 0.04 K/uL (ref 0.00–0.07)
Basophils Absolute: 0 K/uL (ref 0.0–0.1)
Basophils Relative: 0 %
Eosinophils Absolute: 0 K/uL (ref 0.0–0.5)
Eosinophils Relative: 0 %
HCT: 36.1 % (ref 36.0–46.0)
Hemoglobin: 12.8 g/dL (ref 12.0–15.0)
Immature Granulocytes: 1 %
Lymphocytes Relative: 20 %
Lymphs Abs: 1.6 K/uL (ref 0.7–4.0)
MCH: 31.1 pg (ref 26.0–34.0)
MCHC: 35.5 g/dL (ref 30.0–36.0)
MCV: 87.8 fL (ref 80.0–100.0)
Monocytes Absolute: 0.6 K/uL (ref 0.1–1.0)
Monocytes Relative: 7 %
Neutro Abs: 6 K/uL (ref 1.7–7.7)
Neutrophils Relative %: 72 %
Platelets: 225 K/uL (ref 150–400)
RBC: 4.11 MIL/uL (ref 3.87–5.11)
RDW: 12.2 % (ref 11.5–15.5)
WBC: 8.2 K/uL (ref 4.0–10.5)
nRBC: 0 % (ref 0.0–0.2)

## 2024-07-03 LAB — COMPREHENSIVE METABOLIC PANEL WITH GFR
ALT: 11 U/L (ref 0–44)
AST: 18 U/L (ref 15–41)
Albumin: 3.9 g/dL (ref 3.5–5.0)
Alkaline Phosphatase: 68 U/L (ref 38–126)
Anion gap: 9 (ref 5–15)
BUN: 6 mg/dL (ref 6–20)
CO2: 23 mmol/L (ref 22–32)
Calcium: 9 mg/dL (ref 8.9–10.3)
Chloride: 104 mmol/L (ref 98–111)
Creatinine, Ser: 0.49 mg/dL (ref 0.44–1.00)
GFR, Estimated: >60 mL/min (ref >60)
Glucose, Bld: 95 mg/dL (ref 70–99)
Potassium: 3.7 mmol/L (ref 3.5–5.1)
Sodium: 135 mmol/L (ref 135–145)
Total Bilirubin: 0.3 mg/dL (ref 0.0–1.2)
Total Protein: 6.6 g/dL (ref 6.5–8.1)

## 2024-07-03 LAB — URINALYSIS, ROUTINE W REFLEX MICROSCOPIC
Bilirubin Urine: NEGATIVE
Glucose, UA: NEGATIVE mg/dL
Ketones, ur: NEGATIVE mg/dL
Nitrite: NEGATIVE
Protein, ur: NEGATIVE mg/dL
Specific Gravity, Urine: 1.021 (ref 1.005–1.030)
pH: 5 (ref 5.0–8.0)

## 2024-07-03 LAB — HCG, QUANTITATIVE, PREGNANCY: hCG, Beta Chain, Quant, S: 49998 m[IU]/mL — ABNORMAL HIGH (ref <5)

## 2024-07-03 LAB — PREGNANCY, URINE: Preg Test, Ur: POSITIVE — AB

## 2024-07-03 LAB — POC URINE PREG, ED: Preg Test, Ur: POSITIVE — AB

## 2024-07-03 MED ORDER — ACETAMINOPHEN 325 MG PO TABS: 650.0000 mg | ORAL_TABLET | ORAL | 2 refills | Status: AC | PRN

## 2024-07-03 MED ORDER — CEPHALEXIN 500 MG PO CAPS: 500.0000 mg | ORAL_CAPSULE | Freq: Four times a day (QID) | ORAL | 0 refills | Status: AC

## 2024-07-03 MED ORDER — ACETAMINOPHEN 325 MG PO TABS
650.0000 mg | ORAL_TABLET | Freq: Once | ORAL | Status: AC
  Administered 2024-07-03: 650 mg via ORAL
  Filled 2024-07-03: qty 2

## 2024-07-03 NOTE — ED Provider Notes (Signed)
 [facility] Provider Note    Event Date/Time   First MD Initiated Contact with Patient 07/03/24 0830     (approximate)   History   Groin Pain   HPI  Brandy Ward is a 28 y.o. female G2, P1 currently approximately [redacted] weeks pregnant who presents today for evaluation of left groin pain.  She reports that this has been present for the past 2 days.  Denies any injuries.  No vaginal discharge or bleeding.  She reports that she has been nauseated throughout her pregnancy, though no new or different nausea today.  No vomiting.  She reports mild burning with urination.  Patient Active Problem List   Diagnosis Date Noted   Asthma without status asthmaticus 03/18/2019   Learning disability 04/14/2017          Physical Exam   Triage Vital Signs: ED Triage Vitals [07/03/24 0820]  Encounter Vitals Group     BP 122/72     Girls Systolic BP Percentile      Girls Diastolic BP Percentile      Boys Systolic BP Percentile      Boys Diastolic BP Percentile      Pulse Rate 92     Resp 18     Temp 99.5 F (37.5 C)     Temp Source Oral     SpO2 99 %     Weight 190 lb (86.2 kg)     Height 5' 7 (1.702 m)     Head Circumference      Peak Flow      Pain Score 7     Pain Loc      Pain Education      Exclude from Growth Chart     Most recent vital signs: Vitals:   07/03/24 0820 07/03/24 0859  BP: 122/72   Pulse: 92   Resp: 18   Temp: 99.5 F (37.5 C)   SpO2: 99% 99%    Physical Exam Vitals and nursing note reviewed.  Constitutional:      General: Awake and alert. No acute distress.    Appearance: Normal appearance. The patient is normal weight.  HENT:     Head: Normocephalic and atraumatic.     Mouth: Mucous membranes are moist.  Eyes:     General: PERRL. Normal EOMs        Right eye: No discharge.        Left eye: No discharge.     Conjunctiva/sclera: Conjunctivae normal.  Cardiovascular:     Rate and Rhythm: Normal rate and regular rhythm.      Pulses: Normal pulses.  Pulmonary:     Effort: Pulmonary effort is normal. No respiratory distress.     Breath sounds: Normal breath sounds.  Abdominal:     Abdomen is soft. There is no abdominal tenderness. No rebound or guarding. No distention.  Normal-appearing left groin.  No lymphadenopathy or skin changes noted. Musculoskeletal:        General: No swelling. Normal range of motion.     Cervical back: Normal range of motion and neck supple.  Skin:    General: Skin is warm and dry.     Capillary Refill: Capillary refill takes less than 2 seconds.     Findings: No rash.  Neurological:     Mental Status: The patient is awake and alert.      ED Results / Procedures / Treatments   Labs (all labs ordered are listed, but only  abnormal results are displayed) Labs Reviewed  URINALYSIS, ROUTINE W REFLEX MICROSCOPIC - Abnormal; Notable for the following components:      Result Value   Color, Urine YELLOW (*)    APPearance HAZY (*)    Hgb urine dipstick SMALL (*)    Leukocytes,Ua TRACE (*)    Bacteria, UA RARE (*)    All other components within normal limits  PREGNANCY, URINE - Abnormal; Notable for the following components:   Preg Test, Ur POSITIVE (*)    All other components within normal limits  HCG, QUANTITATIVE, PREGNANCY - Abnormal; Notable for the following components:   hCG, Beta Chain, Quant, S 50,001 (*)    All other components within normal limits  POC URINE PREG, ED - Abnormal; Notable for the following components:   Preg Test, Ur Positive (*)    All other components within normal limits  URINE CULTURE  CBC WITH DIFFERENTIAL/PLATELET  COMPREHENSIVE METABOLIC PANEL WITH GFR     EKG     RADIOLOGY I independently reviewed and interpreted imaging and agree with radiologists findings.     PROCEDURES:  Critical Care performed:   Procedures   MEDICATIONS ORDERED IN ED: Medications  acetaminophen  (TYLENOL ) tablet 650 mg (650 mg Oral Given 07/03/24  1213)     IMPRESSION / MDM / ASSESSMENT AND PLAN / ED COURSE  I reviewed the triage vital signs and the nursing notes.   Differential diagnosis includes, but is not limited to, round ligament pain, urinary tract infection, uterine stretching.  Patient is awake and alert, hemodynamically stable and afebrile.  She is nontoxic in appearance.  She has no abdominal tenderness on exam.  No vaginal discharge or bleeding.  Labs obtained in triage are overall reassuring.  Urinalysis does reveal bacteria, will treat for bacteriuria in pregnancy, especially given that she also has burning with urination.  No bleeding, no Rh obtained at this time.  Ultrasound obtained is negative for any acute findings, her pregnancy appears to be developing normally and she estimates to be 10+2.  We discussed symptomatic management and close outpatient follow-up with her OB/GYN.  We also discussed return precautions.  Patient understands and agrees with plan.  She was discharged in stable condition.   Patient's presentation is most consistent with acute complicated illness / injury requiring diagnostic workup.   Clinical Course as of 07/03/24 1301  Sat Jul 03, 2024  1044 Patient is requesting Tylenol  [JP]    Clinical Course User Index [JP] Karinna Beadles E, PA-C     FINAL CLINICAL IMPRESSION(S) / ED DIAGNOSES   Final diagnoses:  Abdominal pain during pregnancy in first trimester  Bacteriuria in pregnancy     Rx / DC Orders   ED Discharge Orders          Ordered    acetaminophen  (TYLENOL ) 325 MG tablet  Every 4 hours PRN        07/03/24 1044    cephALEXin  (KEFLEX ) 500 MG capsule  4 times daily        07/03/24 1214             Note:  This document was prepared using Dragon voice recognition software and may include unintentional dictation errors.   Ricki Vanhandel E, PA-C 07/03/24 1301    Ernest Ronal BRAVO, MD 07/04/24 [phone]

## 2024-07-03 NOTE — ED Triage Notes (Signed)
 Patient reports she is [redacted] weeks pregnant. States her left side groin area started hurting yesterday.   Denies injury Denies vaginal discharge/bleeding

## 2024-07-03 NOTE — Discharge Instructions (Signed)
 Please follow-up with your OB/GYN.  Please return for any new, worsening, or changing symptoms or other concerns.  It was a pleasure caring for you today.

## 2024-07-04 LAB — URINE CULTURE: Culture: NO GROWTH

## 2024-07-30 ENCOUNTER — Emergency Department
Admission: EM | Admit: 2024-07-30 | Discharge: 2024-07-30 | Disposition: A | Discharge status: Home / Self Care | Attending: Emergency Medicine | Admitting: Emergency Medicine

## 2024-07-30 ENCOUNTER — Other Ambulatory Visit: Payer: Self-pay

## 2024-07-30 DIAGNOSIS — E860 Dehydration: Secondary | ICD-10-CM | POA: Insufficient documentation

## 2024-07-30 DIAGNOSIS — R55 Syncope and collapse: Secondary | ICD-10-CM | POA: Insufficient documentation

## 2024-07-30 DIAGNOSIS — R42 Dizziness and giddiness: Secondary | ICD-10-CM

## 2024-07-30 DIAGNOSIS — O99282 Endocrine, nutritional and metabolic diseases complicating pregnancy, second trimester: Secondary | ICD-10-CM | POA: Insufficient documentation

## 2024-07-30 DIAGNOSIS — J45909 Unspecified asthma, uncomplicated: Secondary | ICD-10-CM | POA: Insufficient documentation

## 2024-07-30 DIAGNOSIS — Z3A14 14 weeks gestation of pregnancy: Secondary | ICD-10-CM | POA: Insufficient documentation

## 2024-07-30 LAB — COMPREHENSIVE METABOLIC PANEL WITH GFR
ALT: 6 U/L (ref 0–44)
AST: 15 U/L (ref 15–41)
Albumin: 3.9 g/dL (ref 3.5–5.0)
Alkaline Phosphatase: 74 U/L (ref 38–126)
Anion gap: 12 (ref 5–15)
BUN: 7 mg/dL (ref 6–20)
CO2: 23 mmol/L (ref 22–32)
Calcium: 9.2 mg/dL (ref 8.9–10.3)
Chloride: 102 mmol/L (ref 98–111)
Creatinine, Ser: 0.43 mg/dL — ABNORMAL LOW (ref 0.44–1.00)
GFR, Estimated: >60 mL/min (ref >60)
Glucose, Bld: 106 mg/dL — ABNORMAL HIGH (ref 70–99)
Potassium: 3.9 mmol/L (ref 3.5–5.1)
Sodium: 136 mmol/L (ref 135–145)
Total Bilirubin: 0.3 mg/dL (ref 0.0–1.2)
Total Protein: 6.8 g/dL (ref 6.5–8.1)

## 2024-07-30 LAB — CBC
HCT: 35.6 % — ABNORMAL LOW (ref 36.0–46.0)
Hemoglobin: 12.5 g/dL (ref 12.0–15.0)
MCH: 31.4 pg (ref 26.0–34.0)
MCHC: 35.1 g/dL (ref 30.0–36.0)
MCV: 89.4 fL (ref 80.0–100.0)
Platelets: 240 K/uL (ref 150–400)
RBC: 3.98 MIL/uL (ref 3.87–5.11)
RDW: 12.5 % (ref 11.5–15.5)
WBC: 8.6 K/uL (ref 4.0–10.5)
nRBC: 0 % (ref 0.0–0.2)

## 2024-07-30 LAB — URINALYSIS, ROUTINE W REFLEX MICROSCOPIC
Glucose, UA: NEGATIVE mg/dL
Hgb urine dipstick: NEGATIVE
Ketones, ur: NEGATIVE mg/dL
Leukocytes,Ua: NEGATIVE
Nitrite: NEGATIVE
Protein, ur: NEGATIVE mg/dL
Specific Gravity, Urine: 1.031 — ABNORMAL HIGH (ref 1.005–1.030)
pH: 5 (ref 5.0–8.0)

## 2024-07-30 LAB — POC URINE PREG, ED: Preg Test, Ur: POSITIVE — AB

## 2024-07-30 MED ORDER — LACTATED RINGERS IV BOLUS
1000.0000 mL | Freq: Once | INTRAVENOUS | Status: AC
  Administered 2024-07-30: 1000 mL via INTRAVENOUS

## 2024-07-30 NOTE — ED Triage Notes (Signed)
 Pt to Ed via POV from home. Pt reports passed out 2 days ago and a week ago. Pt reports ongoing dizziness and HA. Pt reports [redacted]wks pregnant and has been treated for UTI twice with no relief. Pt reports continued pain/pressure with urination.

## 2024-07-30 NOTE — ED Provider Notes (Signed)
 [facility] Provider Note   Event Date/Time   First MD Initiated Contact with Patient 07/30/24 2021     (approximate) History  Dizziness ([redacted]wks pregnant )  HPI Brandy Ward is a 28 y.o. female with a stated past medical history of 14-week pregnancy, asthma, and frequent headaches who presents after a week of intermittent lightheadedness with reported multiple episodes of loss of consciousness.  Patient states she has been treated for UTI twice and continues to complain of suprapubic abdominal pain that is worse with urination.  Patient denies any burning with urination or difficulty initiating stream however does endorse polyuria.  Patient states that she has a history of syncopal symptoms like this in the past but usually only when she is sick including things like asthma exacerbations and the flu.  Patient has been pregnant once before did not have any episodes of syncope during that pregnancy. ROS: Patient currently denies any vision changes, tinnitus, difficulty speaking, facial droop, sore throat, chest pain, shortness of breath, abdominal pain, nausea/vomiting/diarrhea, dysuria, or weakness/numbness/paresthesias in any extremity   Physical Exam  Triage Vital Signs: ED Triage Vitals [07/30/24 1703]  Encounter Vitals Group     BP 116/79     Girls Systolic BP Percentile      Girls Diastolic BP Percentile      Boys Systolic BP Percentile      Boys Diastolic BP Percentile      Pulse Rate 93     Resp 18     Temp 98.7 F (37.1 C)     Temp Source Oral     SpO2 98 %     Weight      Height      Head Circumference      Peak Flow      Pain Score 7     Pain Loc      Pain Education      Exclude from Growth Chart    Most recent vital signs: Vitals:   07/30/24 2029 07/30/24 2130  BP: 116/75 111/64  Pulse: 92 68  Resp: 16 16  Temp: 98.6 F (37 C)   SpO2: 100% 100%   General: Awake, oriented x4. CV:  Good peripheral perfusion.  No MGR Resp:  Normal  effort.  CTAB Abd:  No distention. Other:  Middle-aged overweight patient resting comfortably in no acute distress ED Results / Procedures / Treatments  Labs (all labs ordered are listed, but only abnormal results are displayed) Labs Reviewed  COMPREHENSIVE METABOLIC PANEL WITH GFR - Abnormal; Notable for the following components:      Result Value   Glucose, Bld 106 (*)    Creatinine, Ser 0.43 (*)    All other components within normal limits  CBC - Abnormal; Notable for the following components:   HCT 35.6 (*)    All other components within normal limits  URINALYSIS, ROUTINE W REFLEX MICROSCOPIC - Abnormal; Notable for the following components:   Color, Urine YELLOW (*)    APPearance HAZY (*)    Specific Gravity, Urine 1.031 (*)    Bilirubin Urine SMALL (*)    All other components within normal limits  POC URINE PREG, ED - Abnormal; Notable for the following components:   Preg Test, Ur POSITIVE (*)    All other components within normal limits   EKG ED ECG REPORT I, Artist MARLA Kerns, the attending physician, personally viewed and interpreted this ECG. Date: 07/30/2024 EKG Time: 1706 Rate: 92 Rhythm: normal sinus  rhythm QRS Axis: normal Intervals: normal ST/T Wave abnormalities: normal Narrative Interpretation: no evidence of acute ischemia PROCEDURES: Critical Care performed: No Procedures MEDICATIONS ORDERED IN ED: Medications  lactated ringers  bolus 1,000 mL (0 mLs Intravenous Stopped 07/30/24 2159)   IMPRESSION / MDM / ASSESSMENT AND PLAN / ED COURSE  I reviewed the triage vital signs and the nursing notes.                             The patient is on the cardiac monitor to evaluate for evidence of arrhythmia and/or significant heart rate changes. Patient's presentation is most consistent with acute presentation with potential threat to life or bodily function. Patient is a 28 year old female with the above-stated past medical history including 14-week pregnancy  presents complaining of episodic lightheadedness with multiple episodes of syncope DDx: New onset heart failure, pulmonary edema, arrhythmia, vasovagal syncope, orthostatic syncope Plan: CBC, CMP, UA, urine pregnancy, EKG  Laboratory and radiologic valuation/when evidence of acute abnormalities.  Patient shows no signs of arrhythmia on monitor however will need outpatient Holter monitor for to rule out.  Patient's heart rate did improve with a liter of IV crystalloid.  Patient did have increased specific gravity in her urine suggesting at least a partial element of dehydration and her lightheadedness and syncopal symptoms.  Patient was encouraged to continue rehydration.  Patient agrees with plan for discharge at this time with outpatient follow-up for a Holter monitor.  Patient given strict return precautions and all questions answered prior to discharge  Dispo: Discharge home with PCP and cardiology follow-up   FINAL CLINICAL IMPRESSION(S) / ED DIAGNOSES   Final diagnoses:  Dehydration  Lightheadedness  Syncope, unspecified syncope type   Rx / DC Orders   ED Discharge Orders          Ordered    Ambulatory referral to Cardiology       Comments: If you have not heard from the Cardiology office within the next 72 hours please call [phone].   07/30/24 2223           Note:  This document was prepared using Dragon voice recognition software and may include unintentional dictation errors.   Savanah Bayles K, MD 07/30/24 2225

## 2024-07-30 NOTE — ED Notes (Signed)
 Ambulatory to bathroom multiple times without difficulty. Pt states that she feels much better and her headache has almost gone away.

## 2024-07-30 NOTE — ED Notes (Signed)
 Pt to desk, ambulatory without difficulty to inquire about wait times/how many people in front of her. This RN explained unable to give wait times/# of people in front of her. Pt visualized able to speak in full and complete sentences at this time, NAD noted at this time. Pt A&O x4, ambulatory with steady gait.

## 2024-08-05 ENCOUNTER — Ambulatory Visit: Admitting: Medical

## 2024-08-05 NOTE — Progress Notes (Deleted)
°  Cardiology Office Note   Date:  08/05/2024  ID:  REILYNN LAURO, DOB [birth year], MRN [id number] PCP: Pcp, No  [facility] Providers Cardiologist:  None { Click to update primary MD,subspecialty MD or APP then REFRESH:1}    History of Present Illness SHAQUASHA GERSTEL is a 28 y.o. female ***  ROS: ***  Studies Reviewed      *** Risk Assessment/Calculations {Does this patient have ATRIAL FIBRILLATION?[phone]} No BP recorded.  {Refresh Note OR Click here to enter BP  :1}***       Physical Exam VS:  LMP 04/19/2024        Wt Readings from Last 3 Encounters:  07/03/24 190 lb (86.2 kg)  05/31/24 186 lb (84.4 kg)  12/04/23 190 lb (86.2 kg)    GEN: Well nourished, well developed in no acute distress NECK: No JVD; No carotid bruits CARDIAC: ***RRR, no murmurs, rubs, gallops RESPIRATORY:  Clear to auscultation without rales, wheezing or rhonchi  ABDOMEN: Soft, non-tender, non-distended EXTREMITIES:  No edema; No deformity   ASSESSMENT AND PLAN ***    {Are you ordering a CV Procedure (e.g. stress test, cath, DCCV, TEE, etc)?   Press F2        :[id number]}  Dispo: ***  Signed, Lorriane Dehart VEAR Fishman, PA-C

## 2024-08-10 ENCOUNTER — Ambulatory Visit: Attending: Cardiology | Admitting: Cardiology

## 2024-08-25 NOTE — Progress Notes (Signed)
 " Chief Complaint  Patient presents with   Establish Care    Subjective  Brandy Ward is a 29 y.o. female who presents for Establish Care HPI History of Present Illness Brandy Ward is a 29 year old female who presents with episodes of syncope during her second pregnancy. She is accompanied by her mother.  She is currently four months pregnant and has been experiencing episodes of syncope, particularly while standing and engaged in activities. The last two episodes were preceded by lightheadedness and dizziness. Her boyfriend was present during these episodes and assisted her by providing cold water, which helped her recover.  She has a history of syncope dating back to age 56, with previous episodes associated with a urinary tract infection, an IV insertion, a Bartholin gland cyst, and upon seeing blood. The current episodes differ as they are not associated with pain.  She experiences palpitations, describing her heart as racing 'ninety miles an hour' even when lying still. She also has blurry vision, particularly when she has headaches, which occur daily. She takes Tylenol  for her headaches, which she finds effective. She reports not drinking much water.  During her first trimester, she experienced one episode of syncope. She has a history of delivering her first child six weeks early.  Family history includes a brother with a heart murmur.  Review of Systems  Patient Active Problem List  Diagnosis   Asthma without status asthmaticus (HHS-HCC)   GERD (gastroesophageal reflux disease)   Learning disability   History of vaginal discharge   Bartholin's gland abscess   Oral contraceptive prescribed   Supervision of high risk pregnancy in first trimester (HHS-HCC)    Outpatient Medications Prior to Visit  Medication Sig Dispense Refill   acetaminophen  (TYLENOL ) 325 MG tablet Take 650 mg by mouth every 4 (four) hours as needed for Pain     metoclopramide  (REGLAN ) 10 MG tablet  Take 1 tablet (10 mg total) by mouth 3 (three) times daily as needed 90 tablet 3   prenatal vit-iron fum-folic ac (PRENAVITE) tablet Take 1 tablet by mouth once daily     valACYclovir  (VALTREX ) 1000 MG tablet Take 1 tablet (1,000 mg total) by mouth once daily SuppressiveTherapy 90 tablet 3   amoxicillin -clavulanate (AUGMENTIN ) 875-125 mg tablet Take 1 tablet by mouth every 12 (twelve) hours (Patient not taking: Reported on 08/25/2024)     doxylamine -pyridoxine , vit B6, (DICLEGIS ) 10-10 mg DR tablet Take 2 tablets by mouth at bedtime for 90 days (Patient not taking: Reported on 08/25/2024) 60 tablet 2   ELLA 30 mg tablet Take 30 mg by mouth once (Patient not taking: Reported on 08/25/2024)     No facility-administered medications prior to visit.      Objective  Vitals:   08/25/24 1539  BP: 112/62  Pulse: 97  SpO2: 99%  Weight: 83 kg (183 lb)  Height: 170.2 cm (5' 7)  PainSc: 0-No pain   Body mass index is 28.66 kg/m.  Home Vitals:     Physical Exam Physical Exam   Constitutional: alert, in NAD, and communicates well Eye exam: pupils equal and reactive, extraocular eye movements intact. Neck: supple, no thyroid enlargement or cervical adenopathy, and no bruits heard Respiratory: clear to auscultation, without rales or wheezes  Cardiovascular: regular rate and rhythm and without murmurs, rubs or gallops Lower extremities: no lower extremity edema Skin ankles/feet: warm, good capillary refill and no ulcerations or lesions noted Neurological: sensorimotor grossly intact and normal muscle tone  Results  Assessment/Plan:   Assessment & Plan Syncope and orthostatic hypotension in pregnancy Recurrent syncope episodes, primarily when standing, with associated lightheadedness and dizziness. Blood pressure is low, likely exacerbated by pregnancy-related increased blood volume. Differential includes orthostatic hypotension. Increased risk of preterm birth due to syncope  history. No clear etiology identified yet. - Ordered echocardiogram to assess cardiac function. - Applied heart monitor for two weeks to evaluate heart rhythm. - Recommended compression stockings to manage orthostatic symptoms. - Advised increased salt intake to help manage low blood pressure. - Encouraged regular follow-up every month to monitor condition closely.  Pregnancy, [redacted] weeks gestation Currently 16 weeks weeks pregnant with a history of preterm delivery. Increased risk of complications due to syncope and orthostatic hypotension. Regular OB/GYN follow-up is in place. - Continue regular OB/GYN visits. - Monitor for any new symptoms or changes in condition. - Encouraged hydration to prevent dehydration-related headaches. - Consider magnesium B12 complex supplementation for headache management. Diagnoses and all orders for this visit:  Syncope and collapse -     Echo complete; Future -     CARD holter monitoring > 7 days to 15 days - hook up; Future  [redacted] weeks gestation of pregnancy (HHS-HCC) -     Echo complete; Future -     CARD holter monitoring > 7 days to 15 days - hook up; Future  Hypotension, postural -     Echo complete; Future -     CARD holter monitoring > 7 days to 15 days - hook up; Future     Return in about 6 weeks (around 10/06/2024).     Future Appointments     Date/Time Provider Department Center Visit Type   08/31/2024 1:30 PM [facility] OBGYN US  2 [facility] C US  [facility] ANATOMY   08/31/2024 2:45 PM Schermerhorn, Debby Crouch, MD [facility] C ROUTINE PRENATAL VISIT       There are no Patient Instructions on file for this visit.  An after visit summary was provided for the patient either in written format (printed) or through [facility].  This note has been created using automated tools and reviewed for accuracy by [facility].  "

## 2024-08-31 NOTE — Progress Notes (Signed)
 19+1 weeks  Anatomic u/s done await final read by MFM. No previa  Severe headaches in pregnancy . Same in last pregnancy   Neurology saw her last pregnancy and put her on Topiramate 25 mg BID and after 2 weeks increase to 50 mg bid She is eating and drinking ok . + fetal movt   Prior c/s with Dr Verdon , opts for her again  O: 117/78 FH 20cm + DT 150 A: headache in pregnancy  P: consult neurology again  Start topamax regimen again  Afp today   RTC 4 weeks  Recommend influenza vaccine . Risks of not getting discussed
